# Patient Record
Sex: Female | Born: 1980 | Race: White | Hispanic: No | Marital: Married | Smoking: Never smoker
Health system: Southern US, Community
[De-identification: ages and names within clinical notes are randomized; demographics above are authoritative.]

## PROBLEM LIST (undated history)

## (undated) DIAGNOSIS — K219 Gastro-esophageal reflux disease without esophagitis: Secondary | ICD-10-CM

## (undated) DIAGNOSIS — K8020 Calculus of gallbladder without cholecystitis without obstruction: Secondary | ICD-10-CM

## (undated) DIAGNOSIS — R112 Nausea with vomiting, unspecified: Secondary | ICD-10-CM

## (undated) DIAGNOSIS — R079 Chest pain, unspecified: Principal | ICD-10-CM

## (undated) DIAGNOSIS — Z9889 Other specified postprocedural states: Secondary | ICD-10-CM

## (undated) DIAGNOSIS — Z8742 Personal history of other diseases of the female genital tract: Secondary | ICD-10-CM

## (undated) DIAGNOSIS — R51 Headache: Secondary | ICD-10-CM

## (undated) HISTORY — DX: Headache: R51

## (undated) HISTORY — DX: Chest pain, unspecified: R07.9

## (undated) HISTORY — PX: BREAST ENHANCEMENT SURGERY: SHX7

## (undated) HISTORY — PX: AUGMENTATION MAMMAPLASTY: SUR837

---

## 2001-03-16 HISTORY — PX: BREAST ENHANCEMENT SURGERY: SHX7

## 2004-05-14 HISTORY — PX: CERVICAL BIOPSY  W/ LOOP ELECTRODE EXCISION: SUR135

## 2004-12-24 ENCOUNTER — Other Ambulatory Visit: Admission: RE | Admit: 2004-12-24 | Discharge: 2004-12-24 | Payer: Self-pay | Admitting: Obstetrics and Gynecology

## 2005-03-24 ENCOUNTER — Inpatient Hospital Stay (HOSPITAL_COMMUNITY): Admission: AD | Admit: 2005-03-24 | Discharge: 2005-03-24 | Payer: Self-pay | Admitting: Obstetrics and Gynecology

## 2005-03-31 ENCOUNTER — Inpatient Hospital Stay (HOSPITAL_COMMUNITY): Admission: RE | Admit: 2005-03-31 | Discharge: 2005-03-31 | Payer: Self-pay | Admitting: Obstetrics and Gynecology

## 2005-04-07 ENCOUNTER — Inpatient Hospital Stay (HOSPITAL_COMMUNITY): Admission: AD | Admit: 2005-04-07 | Discharge: 2005-04-07 | Payer: Self-pay | Admitting: Obstetrics and Gynecology

## 2005-05-19 ENCOUNTER — Inpatient Hospital Stay (HOSPITAL_COMMUNITY): Admission: AD | Admit: 2005-05-19 | Discharge: 2005-05-19 | Payer: Self-pay | Admitting: Obstetrics and Gynecology

## 2005-05-20 ENCOUNTER — Inpatient Hospital Stay (HOSPITAL_COMMUNITY): Admission: AD | Admit: 2005-05-20 | Discharge: 2005-05-20 | Payer: Self-pay | Admitting: Obstetrics and Gynecology

## 2005-07-26 ENCOUNTER — Inpatient Hospital Stay (HOSPITAL_COMMUNITY): Admission: AD | Admit: 2005-07-26 | Discharge: 2005-07-28 | Payer: Self-pay | Admitting: Obstetrics and Gynecology

## 2006-07-23 ENCOUNTER — Emergency Department (HOSPITAL_COMMUNITY): Admission: EM | Admit: 2006-07-23 | Discharge: 2006-07-23 | Payer: Self-pay | Admitting: Emergency Medicine

## 2006-10-14 ENCOUNTER — Encounter: Admission: RE | Admit: 2006-10-14 | Discharge: 2006-10-14 | Payer: Self-pay | Admitting: Family Medicine

## 2010-08-01 NOTE — Discharge Summary (Signed)
Wanda Stafford               ACCOUNT NO.:  [id number]   MEDICAL RECORD NO.:  [id number]          PATIENT TYPE:  INP   LOCATION:  9103                          FACILITY:  [facility]   PHYSICIAN:  Malachi Pro. Ambrose Mantle, M.D. DATE OF BIRTH:  11/12/1980   DATE OF ADMISSION:  07/26/2005  DATE OF DISCHARGE:  07/28/2005                                 DISCHARGE SUMMARY   A 30 year old white female, para 0-1-0-1, gravida 2, 36+ weeks' gestation,  [facility] August 18, 2005 - by a 9-week ultrasound, presented to labor and delivery  in labor with cervical change to his 5-cm with intact bag of waters.   PRENATAL CARE:  Complicated by preterm cervical shortening to 3-cm at 18 to  20 weeks, placed on a weekly Delalutin injections and bed rest at 27 weeks.  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  Blood group and type O+, negative antibody.  RPR nonreactive.  Rubella equivocal.  Hepatitis B surface antigen negative.  HIV negative.  GC and Chlamydia negative.  Pap smear normal.  Group B strep  negative per patient.  A one-hour Glucola 105.   PAST MEDICAL HISTORY:  1.  In 1998, she had a spontaneous vaginal delivery of a 5 pound 1 ounce      infant at 34+ weeks.  2.  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  Repeat      Pap was advised postpartum.  3.  The patient has had a history of anxiety on no medications.  4.  She did have breast augmentation in 2003.   She has no known allergies.   She is on prenatal vitamins.   Her physical exam is normal.  The cervix 5-cm.  The patient got an epidural  and artificial rupture of the membranes produced clear fluid.  She reached  complete dilatation and pushed well with a spontaneous vaginal delivery of a  vigorous female infant over an intact perineum without Apgar's 8 at one and 9  at five minutes, weight of 5 pounds 15 ounces.  Placenta delivered  spontaneously and handed off for cord  blood donation.  Cervix and rectum  were intact.  Blood loss about 350 cc.  Dr. Senaida Stafford was in attendance.  Postpartum the patient did well and was discharged on the second postpartum  day.   LABORATORY DATA:  Showed initial hemoglobin of 12.4, hematocrit 35.9, white  count 7,300, platelet count 243,000.  RPR nonreactive.  Followup hemoglobin  12.3.   FINAL DIAGNOSES:  Intrauterine pregnancy at 36+ weeks delivered vertex.   OPERATION:  Spontaneous delivery vertex.   FINAL CONDITION:  Improved.   INSTRUCTIONS:  1.  Include our regular discharge instruction booklet.  2.  The patient will be offered the rubella vaccine prior to discharge.  3.  She is advised to return in six weeks for followup examination.   Percocet 5/325, 20 tablets, one every four to six hours as needed for pain  is given at discharge.      [name]  Corwin Levins, M.D.  Electronically Signed     TFH/MEDQ  D:  07/28/2005  T:  07/28/2005  Job:  [id number]

## 2013-03-24 ENCOUNTER — Other Ambulatory Visit: Payer: Self-pay | Admitting: Family Medicine

## 2013-03-24 DIAGNOSIS — R519 Headache, unspecified: Secondary | ICD-10-CM

## 2013-03-24 DIAGNOSIS — R51 Headache: Principal | ICD-10-CM

## 2013-03-27 ENCOUNTER — Encounter: Payer: Self-pay | Admitting: General Surgery

## 2013-03-27 ENCOUNTER — Ambulatory Visit (INDEPENDENT_AMBULATORY_CARE_PROVIDER_SITE_OTHER): Payer: 59 | Admitting: Interventional Cardiology

## 2013-03-27 ENCOUNTER — Ambulatory Visit
Admission: RE | Admit: 2013-03-27 | Discharge: 2013-03-27 | Disposition: A | Discharge status: Home / Self Care | Payer: 59 | Source: Ambulatory Visit | Attending: Family Medicine | Admitting: Family Medicine

## 2013-03-27 ENCOUNTER — Encounter: Payer: Self-pay | Admitting: Interventional Cardiology

## 2013-03-27 VITALS — BP 118/55 | HR 73 | Ht 66.0 in | Wt 140.2 lb

## 2013-03-27 DIAGNOSIS — R51 Headache: Principal | ICD-10-CM

## 2013-03-27 DIAGNOSIS — R079 Chest pain, unspecified: Secondary | ICD-10-CM | POA: Insufficient documentation

## 2013-03-27 DIAGNOSIS — R519 Headache, unspecified: Secondary | ICD-10-CM

## 2013-03-27 NOTE — Progress Notes (Signed)
Patient ID: Wanda Stafford, female   DOB: 07-14-1980, 33 y.o.   MRN: [id number]     Patient ID: Wanda ConnersCarol A Stafford MRN: [id number] DOB/AGE: 07-14-1980 32 y.o.   Referring Physician Dr. Zachery DauerBarnes   Reason for Consultation  Chest tightness  HPI: 33 y/o who has had no significant medical issues.  She had been exercising, but then stopped for a while.  She had been trying to exercise more of late.  SHe tried a boot camp.  It cause some DOE.  Some dizziness with migraines.  Some activities were causing SHOB that don't normal cause issues such as walking or getting out of the bathtub.    Mother had some type of arrhythmia.  It has not been a longterm problem.  Brother was born with a heart murmur.  No early CAD that she knows of.    She also has some chest discomfort. It is not related to exertion. It occurs in the upper left chest and lateral to her left breast. It is worse with deep breathing. She can push on these areas as she did here in the office today and reproduce the pain. She does not recall any trauma to the area.   Current Outpatient Prescriptions  Medication Sig Dispense Refill  . ALPRAZolam (XANAX) 0.25 MG tablet Take 0.25 mg by mouth as needed for anxiety (as needed for anxiety).      . Norgestimate-Ethinyl Estradiol Triphasic (TRI-SPRINTEC) 0.18/0.215/0.25 MG-35 MCG tablet Take 1 tablet by mouth daily.       No current facility-administered medications for this visit.   Past Medical History  Diagnosis Date  . Chest pain   . Headache     Family History  Problem Relation Age of Onset  . Arrhythmia Mother   . Heart murmur Brother     History   Social History  . Marital Status: Married    Spouse Name: N/A    Number of Children: N/A  . Years of Education: N/A   Occupational History  . Not on file.   Social History Main Topics  . Smoking status: Never Smoker   . Smokeless tobacco: Not on file  . Alcohol Use: Yes     Comment: 2 glasses daily  . Drug Use: No  .  Sexual Activity: Not on file   Other Topics Concern  . Not on file   Social History Narrative  . No narrative on file    Past Surgical History  Procedure Laterality Date  . Breast enhancement surgery        (Not in a hospital admission)  Review of systems complete and found to be negative unless listed above .  No nausea, vomiting.  No fever chills, No focal weakness,  No palpitations.  Physical Exam: Filed Vitals:   03/27/13 1445  BP: 118/55  Pulse: 73    Weight: 140 lb 3.2 oz (63.594 kg)  Physical exam:  [place]/AT EOMI No JVD, No carotid bruit RRR S1S2 ; reproducible chest wall pain/soreness in the upper left chest with palpation. No wheezing Soft. NT, nondistended No edema. No focal motor or sensory deficits Normal affect  Labs:   No results found for this basename: WBC, HGB, HCT, MCV, PLT   No results found for this basename: NA, K, CL, CO2, BUN, CREATININE, CALCIUM, LABALBU, PROT, BILITOT, ALKPHOS, ALT, AST, GLUCOSE,  in the last 168 hours No results found for this basename: CKTOTAL, CKMB, CKMBINDEX, TROPONINI    No results found for this basename: CHOL  No results found for this basename: HDL   No results found for this basename: LDLCALC   No results found for this basename: TRIG   No results found for this basename: CHOLHDL   No results found for this basename: LDLDIRECT       EKG: Normal sinus rhythm, RSR prime in V1 and V2. No significant ST segment changes.  ASSESSMENT AND PLAN:   1) abnormal ECG. Minimal abnormality in the precordial leads with the rSR'.  This may be normal for her. Would not plan for any further cardiac testing.  2)  chest pain: Several atypical features. She does a very rigorous exercise regimen called to camp and does not have chest pain with this. I think it is very unlikely that she would have ischemic coronary disease. She describes the pain more as a soreness in her chest wall. She has some today with deep breathing because  she just worked out. She is able to reproduce this with palpation.  Mild shortness of breath as well.  No risk factors for coronary artery disease at this point. We'll not pursue any stress testing. If symptoms get worse, she will let us know. She will followup on an as needed basis. We talked about the importance of regular exercise and primary prevention to avoid coronary artery disease.  Signed:   Fredric Mare, MD, [facility] 03/27/2013, 3:47 PM

## 2013-03-27 NOTE — Patient Instructions (Signed)
Your physician recommends that you continue on your current medications as directed. Please refer to the Current Medication list given to you today.   Your physician recommends that you schedule a follow-up appointment as needed  

## 2013-03-28 ENCOUNTER — Encounter: Payer: Self-pay | Admitting: Interventional Cardiology

## 2013-03-29 ENCOUNTER — Other Ambulatory Visit: Payer: Self-pay

## 2013-04-13 ENCOUNTER — Ambulatory Visit: Payer: Self-pay | Admitting: Cardiology

## 2013-07-12 ENCOUNTER — Other Ambulatory Visit: Payer: Self-pay | Admitting: Family Medicine

## 2013-07-12 DIAGNOSIS — R109 Unspecified abdominal pain: Secondary | ICD-10-CM

## 2013-07-13 ENCOUNTER — Ambulatory Visit
Admission: RE | Admit: 2013-07-13 | Discharge: 2013-07-13 | Disposition: A | Discharge status: Home / Self Care | Payer: 59 | Source: Ambulatory Visit | Attending: Family Medicine | Admitting: Family Medicine

## 2013-07-13 DIAGNOSIS — R109 Unspecified abdominal pain: Secondary | ICD-10-CM

## 2013-07-13 MED ORDER — IOHEXOL 300 MG/ML  SOLN
100.0000 mL | Freq: Once | INTRAMUSCULAR | Status: AC | PRN
  Administered 2013-07-13: 100 mL via INTRAVENOUS

## 2014-03-16 NOTE — L&D Delivery Note (Signed)
Delivery Note Pt reached complete dilation and pushed well.   At 2:48 AM a healthy female was delivered via  (Presentation: OA).  APGAR:8 ,9 ; weight pending .   Placenta status:  Delivered apontaneously .  Cord:  with the following complications: none .    Anesthesia: Epidural  Episiotomy:  none Lacerations: none  Suture Repair: n/a Est. Blood Loss (mL):   Mom to postpartum.  Baby to Couplet care / Skin to Skin.  Oliver Pila 11/09/2014, 3:09 AM

## 2014-05-08 LAB — OB RESULTS CONSOLE RUBELLA ANTIBODY, IGM: Rubella: IMMUNE

## 2014-05-08 LAB — OB RESULTS CONSOLE GC/CHLAMYDIA
Chlamydia: NEGATIVE
Gonorrhea: NEGATIVE

## 2014-05-08 LAB — OB RESULTS CONSOLE ABO/RH: RH Type: POSITIVE

## 2014-05-08 LAB — OB RESULTS CONSOLE HEPATITIS B SURFACE ANTIGEN: Hepatitis B Surface Ag: NEGATIVE

## 2014-05-08 LAB — OB RESULTS CONSOLE HIV ANTIBODY (ROUTINE TESTING): HIV: NONREACTIVE

## 2014-05-08 LAB — OB RESULTS CONSOLE ANTIBODY SCREEN: Antibody Screen: NEGATIVE

## 2014-05-08 LAB — OB RESULTS CONSOLE RPR: RPR: NONREACTIVE

## 2014-10-26 LAB — OB RESULTS CONSOLE GBS: GBS: NEGATIVE

## 2014-11-08 ENCOUNTER — Inpatient Hospital Stay (HOSPITAL_COMMUNITY)
Admission: AD | Admit: 2014-11-08 | Discharge: 2014-11-10 | DRG: 775 | Disposition: A | Discharge status: Home / Self Care | Payer: 59 | Source: Ambulatory Visit | Attending: Obstetrics and Gynecology | Admitting: Obstetrics and Gynecology

## 2014-11-08 ENCOUNTER — Encounter (HOSPITAL_COMMUNITY): Payer: Self-pay | Admitting: *Deleted

## 2014-11-08 DIAGNOSIS — Z3A36 36 weeks gestation of pregnancy: Secondary | ICD-10-CM | POA: Diagnosis present

## 2014-11-08 DIAGNOSIS — O4290 Premature rupture of membranes, unspecified as to length of time between rupture and onset of labor, unspecified weeks of gestation: Secondary | ICD-10-CM | POA: Diagnosis present

## 2014-11-08 LAB — CBC
HCT: 33.5 % — ABNORMAL LOW (ref 36.0–46.0)
Hemoglobin: 11.4 g/dL — ABNORMAL LOW (ref 12.0–15.0)
MCH: 30.3 pg (ref 26.0–34.0)
MCHC: 34 g/dL (ref 30.0–36.0)
MCV: 89.1 fL (ref 78.0–100.0)
Platelets: 199 10*3/uL (ref 150–400)
RBC: 3.76 MIL/uL — ABNORMAL LOW (ref 3.87–5.11)
RDW: 12.4 % (ref 11.5–15.5)
WBC: 7.3 10*3/uL (ref 4.0–10.5)

## 2014-11-08 LAB — POCT FERN TEST: POCT Fern Test: POSITIVE

## 2014-11-08 MED ORDER — FENTANYL 2.5 MCG/ML BUPIVACAINE 1/10 % EPIDURAL INFUSION (WH - ANES)
14.0000 mL/h | INTRAMUSCULAR | Status: DC | PRN
  Administered 2014-11-09: 14 mL/h via EPIDURAL
  Filled 2014-11-08: qty 125

## 2014-11-08 MED ORDER — DIPHENHYDRAMINE HCL 50 MG/ML IJ SOLN
12.5000 mg | INTRAMUSCULAR | Status: DC | PRN
  Administered 2014-11-09: 12.5 mg via INTRAVENOUS
  Filled 2014-11-08: qty 1

## 2014-11-08 MED ORDER — CITRIC ACID-SODIUM CITRATE 334-500 MG/5ML PO SOLN: 30.0000 mL | ORAL | Status: DC | PRN

## 2014-11-08 MED ORDER — OXYCODONE-ACETAMINOPHEN 5-325 MG PO TABS: 1.0000 | ORAL_TABLET | ORAL | Status: DC | PRN

## 2014-11-08 MED ORDER — PHENYLEPHRINE 40 MCG/ML (10ML) SYRINGE FOR IV PUSH (FOR BLOOD PRESSURE SUPPORT)
80.0000 ug | PREFILLED_SYRINGE | INTRAVENOUS | Status: DC | PRN
  Filled 2014-11-08: qty 2
  Filled 2014-11-08: qty 20

## 2014-11-08 MED ORDER — OXYCODONE-ACETAMINOPHEN 5-325 MG PO TABS: 2.0000 | ORAL_TABLET | ORAL | Status: DC | PRN

## 2014-11-08 MED ORDER — OXYTOCIN BOLUS FROM INFUSION
500.0000 mL | INTRAVENOUS | Status: DC
  Administered 2014-11-09: 500 mL via INTRAVENOUS

## 2014-11-08 MED ORDER — ACETAMINOPHEN 325 MG PO TABS: 650.0000 mg | ORAL_TABLET | ORAL | Status: DC | PRN

## 2014-11-08 MED ORDER — LACTATED RINGERS IV SOLN
500.0000 mL | INTRAVENOUS | Status: DC | PRN
  Administered 2014-11-08: 1000 mL via INTRAVENOUS

## 2014-11-08 MED ORDER — LACTATED RINGERS IV SOLN
INTRAVENOUS | Status: DC
  Administered 2014-11-08 – 2014-11-09 (×2): via INTRAVENOUS

## 2014-11-08 MED ORDER — OXYTOCIN 40 UNITS IN LACTATED RINGERS INFUSION - SIMPLE MED: 62.5000 mL/h | INTRAVENOUS | Status: DC

## 2014-11-08 MED ORDER — TERBUTALINE SULFATE 1 MG/ML IJ SOLN: 0.2500 mg | Freq: Once | INTRAMUSCULAR | Status: DC | PRN

## 2014-11-08 MED ORDER — ONDANSETRON HCL 4 MG/2ML IJ SOLN: 4.0000 mg | Freq: Four times a day (QID) | INTRAMUSCULAR | Status: DC | PRN

## 2014-11-08 MED ORDER — LIDOCAINE HCL (PF) 1 % IJ SOLN
30.0000 mL | INTRAMUSCULAR | Status: DC | PRN
  Filled 2014-11-08: qty 30

## 2014-11-08 MED ORDER — OXYTOCIN 40 UNITS IN LACTATED RINGERS INFUSION - SIMPLE MED
1.0000 m[IU]/min | INTRAVENOUS | Status: DC
  Administered 2014-11-08: 2 m[IU]/min via INTRAVENOUS
  Filled 2014-11-08: qty 1000

## 2014-11-08 MED ORDER — EPHEDRINE 5 MG/ML INJ
10.0000 mg | INTRAVENOUS | Status: DC | PRN
  Filled 2014-11-08: qty 2

## 2014-11-08 MED ORDER — BUTORPHANOL TARTRATE 1 MG/ML IJ SOLN: 1.0000 mg | INTRAMUSCULAR | Status: DC | PRN

## 2014-11-08 NOTE — MAU Note (Signed)
Pt presents to MAU with complaints of leakage of fluid that started around 9 tonight. Denies any vaginal bleeding

## 2014-11-08 NOTE — H&P (Signed)
Wanda Stafford is a 34 y.o. female [phone] at 36+ weeks (EDD 12/02/14 by LMP c/w 10 week [place])  presenting for SROM at home about 9pm.  She is having mild contractions.  Prenatal care is complicated by  h/o preterm delivery x 2 at 34 and 36 weeks.  She has received 17-P weekly since [redacted] weeks gestation.  Her husband also tested + for HSV-2 antibodies and she was negative.  Neither have ever noted an outbreak, so I counseled her to use condoms  with intercourse while pregnant.  She measures slightly size>dates.  An [place] 10/25/14 showed baby at 46%ile and AFI 18.  Maternal Medical History:  Reason for admission: Rupture of membranes.   Contractions: Onset was 1-2 hours ago.   Frequency: irregular.   Perceived severity is mild.    Fetal activity: Perceived fetal activity is normal.    Prenatal Complications - Diabetes: none.    Past OB Hx 1998 NSVD 34 weeks 5#1oz 2007  NSVD 36 weeks 5#15oz 2015  SAB  Past Medical History  Diagnosis Date  . Chest pain   . Headache    Past Surgical History  Procedure Laterality Date  . Breast enhancement surgery     Family History: family history includes Arrhythmia in her mother; Heart murmur in her brother. Social History:  reports that she has never smoked. She does not have any smokeless tobacco history on file. She reports that she drinks alcohol. She reports that she does not use illicit drugs.   Prenatal Transfer Tool  Maternal Diabetes: No Genetic Screening: Normal Maternal Ultrasounds/Referrals: Normal Fetal Ultrasounds or other Referrals:  None Maternal Substance Abuse:  No Significant Maternal Medications:  None Significant Maternal Lab Results:  None Other Comments:  None  ROS  Dilation: 3 Effacement (%): 80 Blood pressure 134/81, pulse 83, temperature 98.3 F (36.8 C), resp. rate 18, last menstrual period 02/25/2014. Maternal Exam:  Uterine Assessment: Contraction strength is mild.  Contraction frequency is irregular.    Abdomen: Patient reports no abdominal tenderness. Fetal presentation: vertex  Introitus: Normal vulva. Normal vagina.  Ferning test: positive.  Amniotic fluid character: clear.     Physical Exam  Constitutional: She appears well-developed and well-nourished.  Cardiovascular: Normal rate and regular rhythm.   Respiratory: Effort normal.  GI: Soft.  Genitourinary: Vagina normal.  Neurological: She is alert.  Psychiatric: She has a normal mood and affect.    Prenatal labs: ABO, Rh:  O posiitve Antibody:  negative Rubella:  immune RPR:   NR HBsAg:   Neg HIV:   NR GBS:   Neg One hour GTT 113 First trimester screen and AFP negative CF negative in prior pregnancy  Assessment/Plan: Pt admitted for SROM.  She will be admitted and augmented with pitocin   Syanna Remmert W 11/08/2014, 9:58 PM

## 2014-11-08 NOTE — Progress Notes (Signed)
Dr Senaida Ores notified of pt's arrival ROM,  + FERN orders received to admit pt

## 2014-11-09 ENCOUNTER — Encounter (HOSPITAL_COMMUNITY): Payer: Self-pay

## 2014-11-09 ENCOUNTER — Inpatient Hospital Stay (HOSPITAL_COMMUNITY): Payer: 59 | Admitting: Anesthesiology

## 2014-11-09 LAB — ABO/RH: ABO/RH(D): O POS

## 2014-11-09 LAB — CBC
HCT: 35.1 % — ABNORMAL LOW (ref 36.0–46.0)
Hemoglobin: 11.8 g/dL — ABNORMAL LOW (ref 12.0–15.0)
MCH: 30 pg (ref 26.0–34.0)
MCHC: 33.6 g/dL (ref 30.0–36.0)
MCV: 89.3 fL (ref 78.0–100.0)
Platelets: 184 10*3/uL (ref 150–400)
RBC: 3.93 MIL/uL (ref 3.87–5.11)
RDW: 12.5 % (ref 11.5–15.5)
WBC: 10.9 10*3/uL — ABNORMAL HIGH (ref 4.0–10.5)

## 2014-11-09 LAB — TYPE AND SCREEN
ABO/RH(D): O POS
Antibody Screen: NEGATIVE

## 2014-11-09 LAB — RPR: RPR Ser Ql: NONREACTIVE

## 2014-11-09 MED ORDER — ACETAMINOPHEN 325 MG PO TABS: 650.0000 mg | ORAL_TABLET | ORAL | Status: DC | PRN

## 2014-11-09 MED ORDER — DIBUCAINE 1 % RE OINT: 1.0000 "application " | TOPICAL_OINTMENT | RECTAL | Status: DC | PRN

## 2014-11-09 MED ORDER — LANOLIN HYDROUS EX OINT: TOPICAL_OINTMENT | CUTANEOUS | Status: DC | PRN

## 2014-11-09 MED ORDER — FENTANYL 2.5 MCG/ML BUPIVACAINE 1/10 % EPIDURAL INFUSION (WH - ANES): 14.0000 mL/h | INTRAMUSCULAR | Status: DC | PRN

## 2014-11-09 MED ORDER — ONDANSETRON HCL 4 MG PO TABS: 4.0000 mg | ORAL_TABLET | ORAL | Status: DC | PRN

## 2014-11-09 MED ORDER — LIDOCAINE HCL (PF) 1 % IJ SOLN
INTRAMUSCULAR | Status: DC | PRN
  Administered 2014-11-09: 4 mL
  Administered 2014-11-09: 6 mL via EPIDURAL

## 2014-11-09 MED ORDER — IBUPROFEN 600 MG PO TABS
600.0000 mg | ORAL_TABLET | Freq: Four times a day (QID) | ORAL | Status: DC
  Administered 2014-11-09 – 2014-11-10 (×6): 600 mg via ORAL
  Filled 2014-11-09 (×6): qty 1

## 2014-11-09 MED ORDER — LORATADINE 10 MG PO TABS
10.0000 mg | ORAL_TABLET | Freq: Every day | ORAL | Status: DC | PRN
  Filled 2014-11-09: qty 1

## 2014-11-09 MED ORDER — OXYCODONE-ACETAMINOPHEN 5-325 MG PO TABS: 1.0000 | ORAL_TABLET | ORAL | Status: DC | PRN

## 2014-11-09 MED ORDER — PRENATAL MULTIVITAMIN CH
1.0000 | ORAL_TABLET | Freq: Every day | ORAL | Status: DC
  Administered 2014-11-09 – 2014-11-10 (×2): 1 via ORAL
  Filled 2014-11-09 (×2): qty 1

## 2014-11-09 MED ORDER — BENZOCAINE-MENTHOL 20-0.5 % EX AERO: 1.0000 "application " | INHALATION_SPRAY | CUTANEOUS | Status: DC | PRN

## 2014-11-09 MED ORDER — NALBUPHINE HCL 10 MG/ML IJ SOLN
2.5000 mg | INTRAMUSCULAR | Status: DC | PRN
  Administered 2014-11-09: 2.5 mg via INTRAVENOUS
  Filled 2014-11-09: qty 1

## 2014-11-09 MED ORDER — ZOLPIDEM TARTRATE 5 MG PO TABS: 5.0000 mg | ORAL_TABLET | Freq: Every evening | ORAL | Status: DC | PRN

## 2014-11-09 MED ORDER — SIMETHICONE 80 MG PO CHEW: 80.0000 mg | CHEWABLE_TABLET | ORAL | Status: DC | PRN

## 2014-11-09 MED ORDER — ONDANSETRON HCL 4 MG/2ML IJ SOLN: 4.0000 mg | INTRAMUSCULAR | Status: DC | PRN

## 2014-11-09 MED ORDER — WITCH HAZEL-GLYCERIN EX PADS: 1.0000 "application " | MEDICATED_PAD | CUTANEOUS | Status: DC | PRN

## 2014-11-09 MED ORDER — INFLUENZA VAC SPLIT QUAD 0.5 ML IM SUSY
0.5000 mL | PREFILLED_SYRINGE | INTRAMUSCULAR | Status: AC
  Administered 2014-11-09: 0.5 mL via INTRAMUSCULAR
  Filled 2014-11-09: qty 0.5

## 2014-11-09 MED ORDER — DIPHENHYDRAMINE HCL 25 MG PO CAPS: 25.0000 mg | ORAL_CAPSULE | Freq: Four times a day (QID) | ORAL | Status: DC | PRN

## 2014-11-09 MED ORDER — OXYCODONE-ACETAMINOPHEN 5-325 MG PO TABS: 2.0000 | ORAL_TABLET | ORAL | Status: DC | PRN

## 2014-11-09 MED ORDER — NALBUPHINE HCL 10 MG/ML IJ SOLN: 2.5000 mg | INTRAMUSCULAR | Status: DC

## 2014-11-09 MED ORDER — TETANUS-DIPHTH-ACELL PERTUSSIS 5-2.5-18.5 LF-MCG/0.5 IM SUSP: 0.5000 mL | Freq: Once | INTRAMUSCULAR | Status: DC

## 2014-11-09 MED ORDER — SENNOSIDES-DOCUSATE SODIUM 8.6-50 MG PO TABS
2.0000 | ORAL_TABLET | ORAL | Status: DC
  Administered 2014-11-09: 2 via ORAL
  Filled 2014-11-09: qty 2

## 2014-11-09 NOTE — Anesthesia Preprocedure Evaluation (Signed)

## 2014-11-09 NOTE — Anesthesia Postprocedure Evaluation (Signed)
  Anesthesia Post-op Note  Patient: Wanda Stafford  Procedure(s) Performed: * No procedures listed *  Patient Location: [facility] Unit  Anesthesia Type:Epidural  Level of Consciousness: awake  Airway and Oxygen Therapy: Patient Spontanous Breathing  Post-op Pain: mild  Post-op Assessment: Patient's Cardiovascular Status Stable and Respiratory Function Stable              Post-op Vital Signs: stable  Last Vitals:  Filed Vitals:   11/09/14 0706  BP: 111/63  Pulse: 72  Temp: 36.4 C  Resp: 16    Complications: No apparent anesthesia complications

## 2014-11-09 NOTE — Anesthesia Procedure Notes (Signed)

## 2014-11-09 NOTE — Progress Notes (Signed)
Patient ID: Wanda Stafford, female   DOB: Mar 07, 1981, 34 y.o.   MRN: [id number] DOD  Doing fine no c/o

## 2014-11-10 MED ORDER — OXYCODONE-ACETAMINOPHEN 5-325 MG PO TABS: 1.0000 | ORAL_TABLET | Freq: Four times a day (QID) | ORAL | Status: DC | PRN

## 2014-11-10 MED ORDER — IBUPROFEN 800 MG PO TABS: 800.0000 mg | ORAL_TABLET | Freq: Three times a day (TID) | ORAL | Status: DC | PRN

## 2014-11-10 MED ORDER — PRENATAL MULTIVITAMIN CH: 1.0000 | ORAL_TABLET | Freq: Every day | ORAL | Status: DC

## 2014-11-10 NOTE — Discharge Summary (Signed)
Obstetric Discharge Summary Reason for Admission: rupture of membranes Prenatal Procedures: none Intrapartum Procedures: spontaneous vaginal delivery Postpartum Procedures: none Complications-Operative and Postpartum: none HEMOGLOBIN  Date Value Ref Range Status  11/09/2014 11.8* 12.0 - 15.0 g/dL Final   HCT  Date Value Ref Range Status  11/09/2014 35.1* 36.0 - 46.0 % Final    Physical Exam:  General: alert and no distress Lochia: appropriate Uterine Fundus: firm   Discharge Diagnoses: Term Pregnancy-delivered  Discharge Information: Date: 11/10/2014 Activity: pelvic rest Diet: routine Medications: PNV, Ibuprofen and Percocet Condition: stable Instructions: refer to practice specific booklet Discharge to: home Follow-up Information    Follow up with Oliver Pila, MD. Schedule an appointment as soon as possible for a visit in 6 weeks.   Specialty:  Obstetrics and Gynecology   Why:  for post-partum check   Contact information:   [address] [place] [address] [phone]       Newborn Data: Live born female  Birth Weight: 6 lb 9.8 oz (2999 g) APGAR: 9, 9  Home with mother.  Wanda Stafford, Wanda Stafford 11/10/2014, 8:31 AM

## 2014-11-10 NOTE — Progress Notes (Signed)
Post Partum Day 1 Subjective: no complaints, up ad lib, voiding, tolerating PO and nl lochia, pain controlled  Objective: Blood pressure 107/61, pulse 63, temperature 97.8 F (36.6 C), temperature source Oral, resp. rate 18, height  (1.676 m), weight 85.276 kg (188 lb), last menstrual period 02/25/2014, SpO2 100 %, unknown if currently breastfeeding.  Physical Exam:  General: alert and no distress Lochia: appropriate Uterine Fundus: firm   Recent Labs  11/08/14 2220 11/09/14 0848  HGB 11.4* 11.8*  HCT 33.5* 35.1*    Assessment/Plan: Discharge home per pt request.  Routine PP care, d/c with Motrin, percocet and PNV.  F/u 6wks   LOS: 2 days   Bovard-Stuckert, Iniko Robles 11/10/2014, 7:58 AM

## 2014-11-10 NOTE — Progress Notes (Signed)
Discharge teaching complete. Pt understood all information and did not have nay questions.

## 2015-06-25 DIAGNOSIS — Z113 Encounter for screening for infections with a predominantly sexual mode of transmission: Secondary | ICD-10-CM | POA: Diagnosis not present

## 2015-06-25 DIAGNOSIS — R102 Pelvic and perineal pain: Secondary | ICD-10-CM | POA: Diagnosis not present

## 2015-06-25 DIAGNOSIS — N926 Irregular menstruation, unspecified: Secondary | ICD-10-CM | POA: Diagnosis not present

## 2015-06-27 DIAGNOSIS — N939 Abnormal uterine and vaginal bleeding, unspecified: Secondary | ICD-10-CM | POA: Diagnosis not present

## 2015-07-19 DIAGNOSIS — Z1211 Encounter for screening for malignant neoplasm of colon: Secondary | ICD-10-CM | POA: Diagnosis not present

## 2015-07-19 DIAGNOSIS — Z01818 Encounter for other preprocedural examination: Secondary | ICD-10-CM | POA: Diagnosis not present

## 2015-09-18 DIAGNOSIS — Z1283 Encounter for screening for malignant neoplasm of skin: Secondary | ICD-10-CM | POA: Diagnosis not present

## 2017-04-07 DIAGNOSIS — H9201 Otalgia, right ear: Secondary | ICD-10-CM | POA: Diagnosis not present

## 2017-04-07 DIAGNOSIS — Z130 Encounter for screening for diseases of the blood and blood-forming organs and certain disorders involving the immune mechanism: Secondary | ICD-10-CM | POA: Diagnosis not present

## 2017-04-07 DIAGNOSIS — Z01419 Encounter for gynecological examination (general) (routine) without abnormal findings: Secondary | ICD-10-CM | POA: Diagnosis not present

## 2017-04-07 DIAGNOSIS — Z6824 Body mass index (BMI) 24.0-24.9, adult: Secondary | ICD-10-CM | POA: Diagnosis not present

## 2017-04-07 DIAGNOSIS — F419 Anxiety disorder, unspecified: Secondary | ICD-10-CM | POA: Diagnosis not present

## 2017-04-07 DIAGNOSIS — Z1151 Encounter for screening for human papillomavirus (HPV): Secondary | ICD-10-CM | POA: Diagnosis not present

## 2017-04-07 DIAGNOSIS — Z1389 Encounter for screening for other disorder: Secondary | ICD-10-CM | POA: Diagnosis not present

## 2017-04-07 DIAGNOSIS — Z124 Encounter for screening for malignant neoplasm of cervix: Secondary | ICD-10-CM | POA: Diagnosis not present

## 2017-04-07 DIAGNOSIS — Z3041 Encounter for surveillance of contraceptive pills: Secondary | ICD-10-CM | POA: Diagnosis not present

## 2017-04-08 DIAGNOSIS — Z124 Encounter for screening for malignant neoplasm of cervix: Secondary | ICD-10-CM | POA: Diagnosis not present

## 2017-04-26 DIAGNOSIS — L700 Acne vulgaris: Secondary | ICD-10-CM | POA: Diagnosis not present

## 2017-06-25 DIAGNOSIS — R002 Palpitations: Secondary | ICD-10-CM | POA: Diagnosis not present

## 2017-06-25 DIAGNOSIS — R42 Dizziness and giddiness: Secondary | ICD-10-CM | POA: Diagnosis not present

## 2017-08-05 DIAGNOSIS — E559 Vitamin D deficiency, unspecified: Secondary | ICD-10-CM | POA: Diagnosis not present

## 2017-08-05 DIAGNOSIS — Z0000 Encounter for general adult medical examination without abnormal findings: Secondary | ICD-10-CM | POA: Diagnosis not present

## 2018-01-12 DIAGNOSIS — R350 Frequency of micturition: Secondary | ICD-10-CM | POA: Diagnosis not present

## 2018-01-12 DIAGNOSIS — N898 Other specified noninflammatory disorders of vagina: Secondary | ICD-10-CM | POA: Diagnosis not present

## 2018-01-12 DIAGNOSIS — N760 Acute vaginitis: Secondary | ICD-10-CM | POA: Diagnosis not present

## 2018-01-12 DIAGNOSIS — B373 Candidiasis of vulva and vagina: Secondary | ICD-10-CM | POA: Diagnosis not present

## 2018-02-15 DIAGNOSIS — B373 Candidiasis of vulva and vagina: Secondary | ICD-10-CM | POA: Diagnosis not present

## 2018-02-15 DIAGNOSIS — Z113 Encounter for screening for infections with a predominantly sexual mode of transmission: Secondary | ICD-10-CM | POA: Diagnosis not present

## 2018-02-15 DIAGNOSIS — L292 Pruritus vulvae: Secondary | ICD-10-CM | POA: Diagnosis not present

## 2018-02-15 DIAGNOSIS — N898 Other specified noninflammatory disorders of vagina: Secondary | ICD-10-CM | POA: Diagnosis not present

## 2018-03-07 DIAGNOSIS — K2970 Gastritis, unspecified, without bleeding: Secondary | ICD-10-CM | POA: Diagnosis not present

## 2018-05-05 DIAGNOSIS — N898 Other specified noninflammatory disorders of vagina: Secondary | ICD-10-CM | POA: Diagnosis not present

## 2018-05-05 DIAGNOSIS — L292 Pruritus vulvae: Secondary | ICD-10-CM | POA: Diagnosis not present

## 2018-08-23 DIAGNOSIS — E559 Vitamin D deficiency, unspecified: Secondary | ICD-10-CM | POA: Diagnosis not present

## 2018-08-23 DIAGNOSIS — Z0000 Encounter for general adult medical examination without abnormal findings: Secondary | ICD-10-CM | POA: Diagnosis not present

## 2018-08-23 DIAGNOSIS — Z8249 Family history of ischemic heart disease and other diseases of the circulatory system: Secondary | ICD-10-CM | POA: Diagnosis not present

## 2019-01-09 DIAGNOSIS — Z6826 Body mass index (BMI) 26.0-26.9, adult: Secondary | ICD-10-CM | POA: Diagnosis not present

## 2019-01-09 DIAGNOSIS — Z130 Encounter for screening for diseases of the blood and blood-forming organs and certain disorders involving the immune mechanism: Secondary | ICD-10-CM | POA: Diagnosis not present

## 2019-01-09 DIAGNOSIS — Z1389 Encounter for screening for other disorder: Secondary | ICD-10-CM | POA: Diagnosis not present

## 2019-01-09 DIAGNOSIS — Z01419 Encounter for gynecological examination (general) (routine) without abnormal findings: Secondary | ICD-10-CM | POA: Diagnosis not present

## 2019-02-14 DIAGNOSIS — R1011 Right upper quadrant pain: Secondary | ICD-10-CM | POA: Diagnosis not present

## 2019-02-14 DIAGNOSIS — R1013 Epigastric pain: Secondary | ICD-10-CM | POA: Diagnosis not present

## 2019-02-15 ENCOUNTER — Other Ambulatory Visit: Payer: Self-pay | Admitting: Family Medicine

## 2019-02-15 DIAGNOSIS — K829 Disease of gallbladder, unspecified: Secondary | ICD-10-CM

## 2019-02-15 DIAGNOSIS — R1011 Right upper quadrant pain: Secondary | ICD-10-CM

## 2019-02-16 ENCOUNTER — Other Ambulatory Visit: Payer: Self-pay

## 2019-02-16 ENCOUNTER — Ambulatory Visit (INDEPENDENT_AMBULATORY_CARE_PROVIDER_SITE_OTHER): Payer: BC Managed Care – PPO

## 2019-02-16 DIAGNOSIS — R1011 Right upper quadrant pain: Secondary | ICD-10-CM

## 2019-02-16 DIAGNOSIS — R1010 Upper abdominal pain, unspecified: Secondary | ICD-10-CM | POA: Diagnosis not present

## 2019-02-16 DIAGNOSIS — K829 Disease of gallbladder, unspecified: Secondary | ICD-10-CM

## 2019-03-06 ENCOUNTER — Other Ambulatory Visit: Payer: Self-pay | Admitting: Surgery

## 2019-03-06 DIAGNOSIS — K8020 Calculus of gallbladder without cholecystitis without obstruction: Secondary | ICD-10-CM | POA: Diagnosis not present

## 2019-03-15 ENCOUNTER — Encounter (HOSPITAL_BASED_OUTPATIENT_CLINIC_OR_DEPARTMENT_OTHER): Payer: Self-pay | Admitting: Surgery

## 2019-03-16 ENCOUNTER — Encounter (HOSPITAL_BASED_OUTPATIENT_CLINIC_OR_DEPARTMENT_OTHER): Payer: Self-pay | Admitting: Surgery

## 2019-03-16 ENCOUNTER — Other Ambulatory Visit: Payer: Self-pay

## 2019-03-16 NOTE — Progress Notes (Signed)
Spoke w/ via phone for pre-op interview--- PT Lab needs dos----   Urine preg            Lab results------ no COVID test ------ 03-18-2019 @ 1100 Arrive at ------- 1100 NPO after ------ MN w/ exception clear liquids until 0800 then nothing by mouth (no cream/ milk products) Medications to take morning of surgery ----- NONE Diabetic medication ----- n/a Patient Special Instructions ----- n/a Pre-Op special Istructions ----- n/a Patient verbalized understanding of instructions that were given at this phone interview. Patient denies shortness of breath, chest pain, fever, cough a this phone interview.

## 2019-03-18 ENCOUNTER — Other Ambulatory Visit (HOSPITAL_COMMUNITY)
Admission: RE | Admit: 2019-03-18 | Discharge: 2019-03-18 | Disposition: A | Discharge status: Home / Self Care | Payer: BC Managed Care – PPO | Source: Ambulatory Visit | Attending: Surgery | Admitting: Surgery

## 2019-03-18 DIAGNOSIS — Z01812 Encounter for preprocedural laboratory examination: Secondary | ICD-10-CM | POA: Insufficient documentation

## 2019-03-18 DIAGNOSIS — Z20822 Contact with and (suspected) exposure to covid-19: Secondary | ICD-10-CM | POA: Diagnosis not present

## 2019-03-20 LAB — NOVEL CORONAVIRUS, NAA (HOSP ORDER, SEND-OUT TO REF LAB; TAT 18-24 HRS): SARS-CoV-2, NAA: NOT DETECTED

## 2019-03-21 NOTE — Anesthesia Preprocedure Evaluation (Addendum)
Anesthesia Evaluation  Patient identified by MRN, date of birth, ID band Patient awake    Reviewed: Allergy & Precautions, NPO status , Patient's Chart, lab work & pertinent test results  History of Anesthesia Complications (+) PONV  Airway Mallampati: II  TM Distance: >3 FB Neck ROM: Full    Dental no notable dental hx. (+) Teeth Intact, Dental Advisory Given   Pulmonary neg pulmonary ROS,    Pulmonary exam normal breath sounds clear to auscultation       Cardiovascular Exercise Tolerance: Good Normal cardiovascular exam Rhythm:Regular Rate:Normal     Neuro/Psych negative neurological ROS  negative psych ROS   GI/Hepatic Neg liver ROS, GERD  ,  Endo/Other  negative endocrine ROS  Renal/GU      Musculoskeletal negative musculoskeletal ROS (+)   Abdominal   Peds  Hematology negative hematology ROS (+)   Anesthesia Other Findings   Reproductive/Obstetrics                            Anesthesia Physical Anesthesia Plan  ASA: I  Anesthesia Plan: General   Post-op Pain Management:    Induction: Intravenous  PONV Risk Score and Plan: 4 or greater and Treatment may vary due to age or medical condition, Ondansetron, Dexamethasone, Midazolam and Scopolamine patch - Pre-op  Airway Management Planned: Oral ETT  Additional Equipment: None  Intra-op Plan:   Post-operative Plan: Extubation in OR  Informed Consent: I have reviewed the patients History and Physical, chart, labs and discussed the procedure including the risks, benefits and alternatives for the proposed anesthesia with the patient or authorized representative who has indicated his/her understanding and acceptance.     Dental advisory given  Plan Discussed with: CRNA  Anesthesia Plan Comments:        Anesthesia Quick Evaluation

## 2019-03-21 NOTE — H&P (Signed)
Wanda Stafford Documented: 03/06/2019 11:22 AM Location: [facility] Patient #: [id number] DOB: 1980/08/04 Married / Language: Cleophus Molt / Race: White Female   History of Present Illness (Liany Mumpower A. Ninfa Linden MD; 03/06/2019 11:39 AM) The patient is a 39 year old female who presents for evaluation of gall stones. This is a pleasant 39 year old female referred by Dr. Leighton Ruff for evaluation of symptomatic gallstones. She has been having intermittent attacks of epigastric abdominal pain hurting him back for the last 6 months. It has now gotten much worse with nausea which is been going on for the last 48 hours. She has current pain in the back and abdomen. She denies jaundice. She underwent an ultrasound showing multiple tiny gallstones without evidence of cholecystitis. She is otherwise healthy without complaints. The pain has been moderate severe at times.   Past Surgical History Emeline Gins, [place]; 03/06/2019 11:22 AM) Breast Augmentation  Bilateral.  Diagnostic Studies History Emeline Gins, [place]; 03/06/2019 11:22 AM) Colonoscopy  never Mammogram  never Pap Smear  1-5 years ago  Allergies Emeline Gins, CMA; 03/06/2019 11:24 AM) No Known Drug Allergies [03/06/2019]: Allergies Reconciled   Medication History Emeline Gins, CMA; 03/06/2019 11:26 AM) ALPRAZolam (0.25MG  Tablet, Oral) Active. Tri-Sprintec (0.18/0.215/0.25MG -35 MCG Tablet, Oral) Active. Sertraline HCl (50MG  Tablet, Oral) Active. raNITIdine HCl (150MG  Capsule, Oral) Active. Fluconazole (150MG  Tablet, Oral) Active. Vienva (0.1-20MG -MCG Tablet, Oral) Active. Medications Reconciled  Social History Emeline Gins, [place]; 03/06/2019 11:22 AM) Alcohol use  Occasional alcohol use. Caffeine use  Coffee. No drug use  Tobacco use  Never smoker.  Family History Emeline Gins, [place]; 03/06/2019 11:22 AM) Alcohol Abuse  Mother. Anesthetic complications  Family Members In  General. Arthritis  Mother. Colon Cancer  Mother. Colon Polyps  Mother. Depression  Mother. Hypertension  Father.  Pregnancy / Birth History Emeline Gins, [place]; 03/06/2019 11:22 AM) Contraceptive History  Oral contraceptives. Gravida  4 Maternal age  31-20 Para  3  Other Problems Emeline Gins, [place]; 03/06/2019 11:22 AM) Anxiety Disorder  Back Pain  Gastroesophageal Reflux Disease     Review of Systems Emeline Gins CMA; 03/06/2019 11:22 AM) General Present- Weight Gain. Not Present- Appetite Loss, Chills, Fatigue, Fever, Night Sweats and Weight Loss. Skin Not Present- Change in Wart/Mole, Dryness, Hives, Jaundice, New Lesions, Non-Healing Wounds, Rash and Ulcer. HEENT Present- Seasonal Allergies. Not Present- Earache, Hearing Loss, Hoarseness, Nose Bleed, Oral Ulcers, Ringing in the Ears, Sinus Pain, Sore Throat, Visual Disturbances, Wears glasses/contact lenses and Yellow Eyes. Cardiovascular Not Present- Chest Pain, Difficulty Breathing Lying Down, Leg Cramps, Palpitations, Rapid Heart Rate, Shortness of Breath and Swelling of Extremities. Gastrointestinal Present- Abdominal Pain, Bloating, Excessive gas, Indigestion, Nausea and Vomiting. Not Present- Bloody Stool, Change in Bowel Habits, Chronic diarrhea, Constipation, Difficulty Swallowing, Gets full quickly at meals, Hemorrhoids and Rectal Pain. Female Genitourinary Not Present- Frequency, Nocturia, Painful Urination, Pelvic Pain and Urgency. Musculoskeletal Present- Back Pain. Not Present- Joint Pain, Joint Stiffness, Muscle Pain, Muscle Weakness and Swelling of Extremities. Neurological Not Present- Decreased Memory, Fainting, Headaches, Numbness, Seizures, Tingling, Tremor, Trouble walking and Weakness. Psychiatric Present- Anxiety. Not Present- Bipolar, Change in Sleep Pattern, Depression, Fearful and Frequent crying. Endocrine Not Present- Cold Intolerance, Excessive Hunger, Hair Changes, Heat Intolerance,  Hot flashes and New Diabetes. Hematology Not Present- Blood Thinners, Easy Bruising, Excessive bleeding, Gland problems, HIV and Persistent Infections.  Vitals Emeline Gins CMA; 03/06/2019 11:24 AM) 03/06/2019 11:24 AM Weight: 165 lb Height: 66in Body Surface Area: 1.84 m Body Mass Index: 26.63 kg/m  Temp.:  98.82F  Pulse: 74 (Regular)  BP: 110/72 (Sitting, Left Arm, Standard)       Physical Exam (Dorianna Mckiver A. Magnus Ivan MD; 03/06/2019 11:39 AM) General Mental Status-Alert. General Appearance-Consistent with stated age. Hydration-Well hydrated. Voice-Normal.  Head and Neck Head-normocephalic, atraumatic with no lesions or palpable masses.  Eye Eyeball - Bilateral-Extraocular movements intact. Sclera/Conjunctiva - Bilateral-No scleral icterus.  Chest and Lung Exam Chest and lung exam reveals -quiet, even and easy respiratory effort with no use of accessory muscles and on auscultation, normal breath sounds, no adventitious sounds and normal vocal resonance. Inspection Chest Wall - Normal. Back - normal.  Cardiovascular Cardiovascular examination reveals -on palpation PMI is normal in location and amplitude, no palpable S3 or S4. Normal cardiac borders., normal heart sounds, regular rate and rhythm with no murmurs, carotid auscultation reveals no bruits and normal pedal pulses bilaterally.  Abdomen Inspection Inspection of the abdomen reveals - No Hernias. Skin - Scar - no surgical scars. Palpation/Percussion Palpation and Percussion of the abdomen reveal - Soft, Non Tender, No Rebound tenderness, No Rigidity (guarding) and No hepatosplenomegaly. Auscultation Auscultation of the abdomen reveals - Bowel sounds normal. Note: There is minimal abdominal tenderness but no peritonitis. There is no jaundice   Neurologic - Did not examine.  Musculoskeletal - Did not examine.    Assessment & Plan (Zaydrian Batta A. Magnus Ivan MD; 03/06/2019 11:40  AM) SYMPTOMATIC CHOLELITHIASIS (K80.20) Impression: This is a patient with symptomatic gallstones. She is having fairly severe attacks much more frequently so urgent cholecystectomy is recommended. We discussed the reasons for this in detail. I discussed surgical procedure in detail. I gave her literature regarding surgery. I discussed the risks which includes but is not limited to bleeding, infection, injury to surrounding structures, bile duct injury, bile leak, cardiopulmonary issues, the need to convert to an open procedure, postoperative recovery, etc. She understands and wishes to proceed with surgery as soon as possible. I encouraged her to call and head to the emergency department if her pain acutely worsens Current Plans Started Zofran 4 MG Oral Tablet, 1 (one) Tablet every six hours, as needed, #20, 03/06/2019, Ref. x2. Started oxyCODONE HCl 5 MG Oral Tablet, 1 (one) Tablet every six hours, as needed, #25, 03/06/2019, No Refill.

## 2019-03-22 ENCOUNTER — Encounter (HOSPITAL_BASED_OUTPATIENT_CLINIC_OR_DEPARTMENT_OTHER)
Admission: RE | Disposition: A | Discharge status: Home / Self Care | Payer: Self-pay | Source: Home / Self Care | Attending: Surgery

## 2019-03-22 ENCOUNTER — Encounter (HOSPITAL_BASED_OUTPATIENT_CLINIC_OR_DEPARTMENT_OTHER): Payer: Self-pay | Admitting: Surgery

## 2019-03-22 ENCOUNTER — Ambulatory Visit (HOSPITAL_BASED_OUTPATIENT_CLINIC_OR_DEPARTMENT_OTHER)
Admission: RE | Admit: 2019-03-22 | Discharge: 2019-03-22 | Disposition: A | Discharge status: Home / Self Care | Payer: BC Managed Care – PPO | Attending: Surgery | Admitting: Surgery

## 2019-03-22 ENCOUNTER — Ambulatory Visit (HOSPITAL_BASED_OUTPATIENT_CLINIC_OR_DEPARTMENT_OTHER): Payer: BC Managed Care – PPO | Admitting: Anesthesiology

## 2019-03-22 ENCOUNTER — Other Ambulatory Visit: Payer: Self-pay

## 2019-03-22 DIAGNOSIS — Z79899 Other long term (current) drug therapy: Secondary | ICD-10-CM | POA: Insufficient documentation

## 2019-03-22 DIAGNOSIS — K811 Chronic cholecystitis: Secondary | ICD-10-CM | POA: Diagnosis not present

## 2019-03-22 DIAGNOSIS — K8020 Calculus of gallbladder without cholecystitis without obstruction: Secondary | ICD-10-CM | POA: Diagnosis not present

## 2019-03-22 DIAGNOSIS — F419 Anxiety disorder, unspecified: Secondary | ICD-10-CM | POA: Insufficient documentation

## 2019-03-22 DIAGNOSIS — K8010 Calculus of gallbladder with chronic cholecystitis without obstruction: Secondary | ICD-10-CM | POA: Insufficient documentation

## 2019-03-22 DIAGNOSIS — K219 Gastro-esophageal reflux disease without esophagitis: Secondary | ICD-10-CM | POA: Diagnosis not present

## 2019-03-22 DIAGNOSIS — Z793 Long term (current) use of hormonal contraceptives: Secondary | ICD-10-CM | POA: Diagnosis not present

## 2019-03-22 HISTORY — DX: Other specified postprocedural states: Z98.890

## 2019-03-22 HISTORY — DX: Nausea with vomiting, unspecified: R11.2

## 2019-03-22 HISTORY — DX: Calculus of gallbladder without cholecystitis without obstruction: K80.20

## 2019-03-22 HISTORY — DX: Gastro-esophageal reflux disease without esophagitis: K21.9

## 2019-03-22 HISTORY — DX: Personal history of other diseases of the female genital tract: Z87.42

## 2019-03-22 HISTORY — PX: CHOLECYSTECTOMY: SHX55

## 2019-03-22 LAB — POCT PREGNANCY, URINE: Preg Test, Ur: NEGATIVE

## 2019-03-22 SURGERY — LAPAROSCOPIC CHOLECYSTECTOMY
Anesthesia: General

## 2019-03-22 MED ORDER — CHLORHEXIDINE GLUCONATE CLOTH 2 % EX PADS
6.0000 | MEDICATED_PAD | Freq: Once | CUTANEOUS | Status: DC
  Filled 2019-03-22: qty 6

## 2019-03-22 MED ORDER — DEXAMETHASONE SODIUM PHOSPHATE 10 MG/ML IJ SOLN
INTRAMUSCULAR | Status: AC
  Filled 2019-03-22: qty 1

## 2019-03-22 MED ORDER — ONDANSETRON HCL 4 MG/2ML IJ SOLN
INTRAMUSCULAR | Status: AC
  Filled 2019-03-22: qty 2

## 2019-03-22 MED ORDER — ARTIFICIAL TEARS OPHTHALMIC OINT
TOPICAL_OINTMENT | OPHTHALMIC | Status: AC
  Filled 2019-03-22: qty 3.5

## 2019-03-22 MED ORDER — CEFAZOLIN SODIUM-DEXTROSE 2-4 GM/100ML-% IV SOLN
2.0000 g | INTRAVENOUS | Status: DC
  Filled 2019-03-22: qty 100

## 2019-03-22 MED ORDER — ONDANSETRON HCL 4 MG/2ML IJ SOLN
INTRAMUSCULAR | Status: DC | PRN
  Administered 2019-03-22: 4 mg via INTRAVENOUS

## 2019-03-22 MED ORDER — KETOROLAC TROMETHAMINE 30 MG/ML IJ SOLN
INTRAMUSCULAR | Status: AC
  Filled 2019-03-22: qty 1

## 2019-03-22 MED ORDER — OXYCODONE HCL 5 MG/5ML PO SOLN
5.0000 mg | Freq: Once | ORAL | Status: AC | PRN
  Filled 2019-03-22: qty 5

## 2019-03-22 MED ORDER — CELECOXIB 200 MG PO CAPS
200.0000 mg | ORAL_CAPSULE | ORAL | Status: AC
  Administered 2019-03-22: 200 mg via ORAL
  Filled 2019-03-22: qty 1

## 2019-03-22 MED ORDER — HYDROMORPHONE HCL 1 MG/ML IJ SOLN
0.2500 mg | INTRAMUSCULAR | Status: DC | PRN
  Administered 2019-03-22 (×2): 0.5 mg via INTRAVENOUS
  Filled 2019-03-22: qty 0.5

## 2019-03-22 MED ORDER — SUGAMMADEX SODIUM 200 MG/2ML IV SOLN
INTRAVENOUS | Status: DC | PRN
  Administered 2019-03-22: 200 mg via INTRAVENOUS

## 2019-03-22 MED ORDER — ROCURONIUM BROMIDE 10 MG/ML (PF) SYRINGE
PREFILLED_SYRINGE | INTRAVENOUS | Status: DC | PRN
  Administered 2019-03-22: 40 mg via INTRAVENOUS

## 2019-03-22 MED ORDER — ONDANSETRON HCL 4 MG/2ML IJ SOLN
4.0000 mg | Freq: Once | INTRAMUSCULAR | Status: DC | PRN
  Filled 2019-03-22: qty 2

## 2019-03-22 MED ORDER — HYDROMORPHONE HCL 1 MG/ML IJ SOLN
INTRAMUSCULAR | Status: AC
  Filled 2019-03-22: qty 1

## 2019-03-22 MED ORDER — ACETAMINOPHEN 500 MG PO TABS
1000.0000 mg | ORAL_TABLET | ORAL | Status: AC
  Administered 2019-03-22: 11:00:00 1000 mg via ORAL
  Filled 2019-03-22: qty 2

## 2019-03-22 MED ORDER — OXYCODONE-ACETAMINOPHEN 5-325 MG PO TABS: 1.0000 | ORAL_TABLET | Freq: Four times a day (QID) | ORAL | 0 refills | Status: DC | PRN

## 2019-03-22 MED ORDER — SCOPOLAMINE 1 MG/3DAYS TD PT72
MEDICATED_PATCH | TRANSDERMAL | Status: AC
  Filled 2019-03-22: qty 1

## 2019-03-22 MED ORDER — FENTANYL CITRATE (PF) 100 MCG/2ML IJ SOLN
INTRAMUSCULAR | Status: AC
  Filled 2019-03-22: qty 2

## 2019-03-22 MED ORDER — CELECOXIB 200 MG PO CAPS
ORAL_CAPSULE | ORAL | Status: AC
  Filled 2019-03-22: qty 1

## 2019-03-22 MED ORDER — SCOPOLAMINE 1 MG/3DAYS TD PT72
1.0000 | MEDICATED_PATCH | TRANSDERMAL | Status: DC
  Administered 2019-03-22: 1.5 mg via TRANSDERMAL
  Filled 2019-03-22: qty 1

## 2019-03-22 MED ORDER — LIDOCAINE 2% (20 MG/ML) 5 ML SYRINGE
INTRAMUSCULAR | Status: AC
  Filled 2019-03-22: qty 5

## 2019-03-22 MED ORDER — LACTATED RINGERS IV SOLN
INTRAVENOUS | Status: DC
  Filled 2019-03-22: qty 1000

## 2019-03-22 MED ORDER — MIDAZOLAM HCL 2 MG/2ML IJ SOLN
INTRAMUSCULAR | Status: DC | PRN
  Administered 2019-03-22 (×2): 2 mg via INTRAVENOUS

## 2019-03-22 MED ORDER — GABAPENTIN 300 MG PO CAPS
ORAL_CAPSULE | ORAL | Status: AC
  Filled 2019-03-22: qty 1

## 2019-03-22 MED ORDER — GABAPENTIN 300 MG PO CAPS
300.0000 mg | ORAL_CAPSULE | ORAL | Status: AC
  Administered 2019-03-22: 11:00:00 300 mg via ORAL
  Filled 2019-03-22: qty 1

## 2019-03-22 MED ORDER — BUPIVACAINE HCL (PF) 0.5 % IJ SOLN
INTRAMUSCULAR | Status: DC | PRN
  Administered 2019-03-22: 20 mL

## 2019-03-22 MED ORDER — FENTANYL CITRATE (PF) 100 MCG/2ML IJ SOLN
INTRAMUSCULAR | Status: DC | PRN
  Administered 2019-03-22: 100 ug via INTRAVENOUS
  Administered 2019-03-22 (×2): 50 ug via INTRAVENOUS

## 2019-03-22 MED ORDER — ACETAMINOPHEN 500 MG PO TABS
ORAL_TABLET | ORAL | Status: AC
  Filled 2019-03-22: qty 2

## 2019-03-22 MED ORDER — LIDOCAINE 2% (20 MG/ML) 5 ML SYRINGE
INTRAMUSCULAR | Status: DC | PRN
  Administered 2019-03-22: 80 mg via INTRAVENOUS

## 2019-03-22 MED ORDER — DEXAMETHASONE SODIUM PHOSPHATE 10 MG/ML IJ SOLN
INTRAMUSCULAR | Status: DC | PRN
  Administered 2019-03-22: 10 mg via INTRAVENOUS

## 2019-03-22 MED ORDER — MIDAZOLAM HCL 2 MG/2ML IJ SOLN
INTRAMUSCULAR | Status: AC
  Filled 2019-03-22: qty 2

## 2019-03-22 MED ORDER — KETOROLAC TROMETHAMINE 30 MG/ML IJ SOLN
30.0000 mg | Freq: Once | INTRAMUSCULAR | Status: AC | PRN
  Administered 2019-03-22: 30 mg via INTRAVENOUS
  Filled 2019-03-22: qty 1

## 2019-03-22 MED ORDER — PROPOFOL 10 MG/ML IV BOLUS
INTRAVENOUS | Status: DC | PRN
  Administered 2019-03-22: 150 mg via INTRAVENOUS

## 2019-03-22 MED ORDER — CEFAZOLIN SODIUM-DEXTROSE 2-4 GM/100ML-% IV SOLN
INTRAVENOUS | Status: AC
  Filled 2019-03-22: qty 100

## 2019-03-22 MED ORDER — OXYCODONE HCL 5 MG PO TABS
5.0000 mg | ORAL_TABLET | Freq: Once | ORAL | Status: AC | PRN
  Administered 2019-03-22: 5 mg via ORAL
  Filled 2019-03-22: qty 1

## 2019-03-22 MED ORDER — ROCURONIUM BROMIDE 10 MG/ML (PF) SYRINGE
PREFILLED_SYRINGE | INTRAVENOUS | Status: AC
  Filled 2019-03-22: qty 10

## 2019-03-22 MED ORDER — PROPOFOL 10 MG/ML IV BOLUS
INTRAVENOUS | Status: AC
  Filled 2019-03-22: qty 20

## 2019-03-22 MED ORDER — OXYCODONE HCL 5 MG PO TABS
ORAL_TABLET | ORAL | Status: AC
  Filled 2019-03-22: qty 1

## 2019-03-22 SURGICAL SUPPLY — 58 items
ADH SKN CLS APL DERMABOND .7 (GAUZE/BANDAGES/DRESSINGS) ×1
APL PRP STRL LF DISP 70% ISPRP (MISCELLANEOUS) ×1
APL SKNCLS STERI-STRIP NONHPOA (GAUZE/BANDAGES/DRESSINGS) ×1
APL SWBSTK 6 STRL LF DISP (MISCELLANEOUS) ×1
APPLICATOR COTTON TIP 6 STRL (MISCELLANEOUS) ×1 IMPLANT
APPLICATOR COTTON TIP 6IN STRL (MISCELLANEOUS) ×2
APPLIER CLIP 5 13 M/L LIGAMAX5 (MISCELLANEOUS) ×2
APR CLP MED LRG 5 ANG JAW (MISCELLANEOUS) ×1
BAG RETRIEVAL 10 (BASKET)
BENZOIN TINCTURE PRP APPL 2/3 (GAUZE/BANDAGES/DRESSINGS) ×2 IMPLANT
BLADE CLIPPER SENSICLIP SURGIC (BLADE) IMPLANT
CANISTER SUCT 3000ML PPV (MISCELLANEOUS) IMPLANT
CANISTER SUCTION 1200CC (MISCELLANEOUS) IMPLANT
CHLORAPREP W/TINT 26 (MISCELLANEOUS) ×2 IMPLANT
CLIP APPLIE 5 13 M/L LIGAMAX5 (MISCELLANEOUS) ×1 IMPLANT
COVER WAND RF STERILE (DRAPES) ×2 IMPLANT
DERMABOND ADVANCED (GAUZE/BANDAGES/DRESSINGS) ×1
DERMABOND ADVANCED .7 DNX12 (GAUZE/BANDAGES/DRESSINGS) IMPLANT
DISSECTOR BLUNT TIP ENDO 5MM (MISCELLANEOUS) IMPLANT
DRAPE C-ARM 42X72 X-RAY (DRAPES) ×2 IMPLANT
DRAPE LAPAROSCOPIC ABDOMINAL (DRAPES) ×2 IMPLANT
ELECT REM PT RETURN 9FT ADLT (ELECTROSURGICAL) ×2
ELECTRODE REM PT RTRN 9FT ADLT (ELECTROSURGICAL) ×1 IMPLANT
GLOVE BIOGEL PI IND STRL 6.5 (GLOVE) IMPLANT
GLOVE BIOGEL PI IND STRL 7.0 (GLOVE) IMPLANT
GLOVE BIOGEL PI IND STRL 7.5 (GLOVE) IMPLANT
GLOVE BIOGEL PI INDICATOR 6.5 (GLOVE)
GLOVE BIOGEL PI INDICATOR 7.0 (GLOVE)
GLOVE BIOGEL PI INDICATOR 7.5 (GLOVE)
GLOVE SURG SIGNA 7.5 PF LTX (GLOVE) ×2 IMPLANT
GOWN STRL REUS W/ TWL LRG LVL3 (GOWN DISPOSABLE) ×1 IMPLANT
GOWN STRL REUS W/ TWL XL LVL3 (GOWN DISPOSABLE) ×1 IMPLANT
GOWN STRL REUS W/TWL LRG LVL3 (GOWN DISPOSABLE) ×2
GOWN STRL REUS W/TWL XL LVL3 (GOWN DISPOSABLE) ×2
KIT TURNOVER CYSTO (KITS) ×2 IMPLANT
MANIFOLD NEPTUNE II (INSTRUMENTS) IMPLANT
PACK BASIN DAY SURGERY FS (CUSTOM PROCEDURE TRAY) ×2 IMPLANT
PADDING ION DISPOSABLE (MISCELLANEOUS) ×2 IMPLANT
SCISSORS LAP 5X35 DISP (ENDOMECHANICALS) ×1 IMPLANT
SET CHOLANGIOGRAPH MIX (MISCELLANEOUS) IMPLANT
SET IRRIG TUBING LAPAROSCOPIC (IRRIGATION / IRRIGATOR) ×2 IMPLANT
SLEEVE SCD COMPRESS KNEE MED (MISCELLANEOUS) ×2 IMPLANT
SOLUTION ANTI FOG 6CC (MISCELLANEOUS) ×2 IMPLANT
STRIP CLOSURE SKIN 1/2X4 (GAUZE/BANDAGES/DRESSINGS) ×2 IMPLANT
SURGILUBE 2OZ TUBE FLIPTOP (MISCELLANEOUS) ×2 IMPLANT
SUT MON AB 4-0 PC3 18 (SUTURE) ×2 IMPLANT
SUT VICRYL 0 UR6 27IN ABS (SUTURE) IMPLANT
SYS BAG RETRIEVAL 10MM (BASKET)
SYSTEM BAG RETRIEVAL 10MM (BASKET) IMPLANT
TOWEL OR 17X26 10 PK STRL BLUE (TOWEL DISPOSABLE) ×4 IMPLANT
TRAY LAP CHOLE (CUSTOM PROCEDURE TRAY) ×2 IMPLANT
TRAY LAPAROSCOPIC (CUSTOM PROCEDURE TRAY) ×2 IMPLANT
TROCAR BLADELESS OPT 5 75 (ENDOMECHANICALS) ×8 IMPLANT
TROCAR XCEL BLADELESS 5X75MML (TROCAR) ×4 IMPLANT
TROCAR XCEL BLUNT TIP 100MML (ENDOMECHANICALS) ×2 IMPLANT
TUBE CONNECTING 12X1/4 (SUCTIONS) IMPLANT
TUBING INSUFFLATION 10FT LAP (TUBING) ×2 IMPLANT
WATER STERILE IRR 500ML POUR (IV SOLUTION) IMPLANT

## 2019-03-22 NOTE — Op Note (Signed)
Laparoscopic Cholecystectomy Procedure Note  Indications: This patient presents with symptomatic gallbladder disease and will undergo laparoscopic cholecystectomy.  Pre-operative Diagnosis: chronic cholecystitis with gallstones  Post-operative Diagnosis: Same  Surgeon: Abigail Miyamoto   Assistants: 0  Anesthesia: General endotracheal anesthesia  ASA Class: 1  Procedure Details  The patient was seen again in the Holding Room. The risks, benefits, complications, treatment options, and expected outcomes were discussed with the patient. The possibilities of reaction to medication, pulmonary aspiration, perforation of viscus, bleeding, recurrent infection, finding a normal gallbladder, the need for additional procedures, failure to diagnose a condition, the possible need to convert to an open procedure, and creating a complication requiring transfusion or operation were discussed with the patient. The likelihood of improving the patient's symptoms with return to their baseline status is good.  The patient and/or family concurred with the proposed plan, giving informed consent. The site of surgery properly noted. The patient was taken to Operating Room, identified as Farrel Conners and the procedure verified as Laparoscopic Cholecystectomy with Intraoperative Cholangiogram. A Time Out was held and the above information confirmed.  Prior to the induction of general anesthesia, antibiotic prophylaxis was administered. General endotracheal anesthesia was then administered and tolerated well. After the induction, the abdomen was prepped with Chloraprep and draped in sterile fashion. The patient was positioned in the supine position.  Local anesthetic agent was injected into the skin near the umbilicus and an incision made. We dissected down to the abdominal fascia with blunt dissection.  The fascia was incised vertically and we entered the peritoneal cavity bluntly.  A pursestring suture of 0-Vicryl was  placed around the fascial opening.  The Hasson cannula was inserted and secured with the stay suture.  Pneumoperitoneum was then created with CO2 and tolerated well without any adverse changes in the patient's vital signs. A 5-mm port was placed in the subxiphoid position.  Two 5-mm ports were placed in the right upper quadrant. All skin incisions were infiltrated with a local anesthetic agent before making the incision and placing the trocars.   We positioned the patient in reverse Trendelenburg, tilted slightly to the patient's left.  The gallbladder was identified, the fundus grasped and retracted cephalad. Adhesions were lysed bluntly and with the electrocautery where indicated, taking care not to injure any adjacent organs or viscus. The infundibulum was grasped and retracted laterally, exposing the peritoneum overlying the triangle of Calot. This was then divided and exposed in a blunt fashion. The cystic duct was clearly identified and bluntly dissected circumferentially. A critical view of the cystic duct and cystic artery was obtained.  The cystic duct was then ligated with clips and divided. The cystic artery was, dissected free, ligated with clips and divided as well.   The gallbladder was dissected from the liver bed in retrograde fashion with the electrocautery. The gallbladder was removed and placed in an Endocatch sac. The liver bed was irrigated and inspected. Hemostasis was achieved with the electrocautery. Copious irrigation was utilized and was repeatedly aspirated until clear.  The gallbladder and Endocatch sac were then removed through the umbilical port site.  The pursestring suture was used to close the umbilical fascia.    We again inspected the right upper quadrant for hemostasis.  Pneumoperitoneum was released as we removed the trocars.  4-0 Monocryl was used to close the skin.   Skin glue was then applied. The patient was then extubated and brought to the recovery room in stable  condition. Instrument, sponge, and needle  counts were correct at closure and at the conclusion of the case.   Findings: Cholecystitis with Cholelithiasis  Estimated Blood Loss: Minimal         Drains: 0         Specimens: Gallbladder           Complications: None; patient tolerated the procedure well.         Disposition: PACU - hemodynamically stable.         Condition: stable

## 2019-03-22 NOTE — Discharge Instructions (Signed)
[facility] ______[facility] LAPAROSCOPIC SURGERY: POST OP INSTRUCTIONS Always review your discharge instruction sheet given to you by the facility where your surgery was performed. IF YOU HAVE DISABILITY OR FAMILY LEAVE FORMS, YOU MUST BRING THEM TO THE OFFICE FOR PROCESSING.   DO NOT GIVE THEM TO YOUR DOCTOR.  1. A prescription for pain medication may be given to you upon discharge.  Take your pain medication as prescribed, if needed.  If narcotic pain medicine is not needed, then you may take acetaminophen (Tylenol) or ibuprofen (Advil) as needed. 2. Take your usually prescribed medications unless otherwise directed. 3. If you need a refill on your pain medication, please contact your pharmacy.  They will contact our office to request authorization. Prescriptions will not be filled after 5pm or on week-ends. 4. You should follow a light diet the first few days after arrival home, such as soup and crackers, etc.  Be sure to include lots of fluids daily. 5. Most patients will experience some swelling and bruising in the area of the incisions.  Ice packs will help.  Swelling and bruising can take several days to resolve.  6. It is common to experience some constipation if taking pain medication after surgery.  Increasing fluid intake and taking a stool softener (such as Colace) will usually help or prevent this problem from occurring.  A mild laxative (Milk of Magnesia or Miralax) should be taken according to package instructions if there are no bowel movements after 48 hours. 7. Unless discharge instructions indicate otherwise, you may remove your bandages 24-48 hours after surgery, and you may shower at that time.  You may have steri-strips (small skin tapes) in place directly over the incision.  These strips should be left on the skin for 7-10 days.  If your surgeon used skin glue on the incision, you may shower in 24 hours.  The glue will flake off over the next 2-3 weeks.  Any sutures or  staples will be removed at the office during your follow-up visit. 8. ACTIVITIES:  You may resume regular (light) daily activities beginning the next day--such as daily self-care, walking, climbing stairs--gradually increasing activities as tolerated.  You may have sexual intercourse when it is comfortable.  Refrain from any heavy lifting or straining until approved by your doctor. a. You may drive when you are no longer taking prescription pain medication, you can comfortably wear a seatbelt, and you can safely maneuver your car and apply brakes. b. RETURN TO WORK:  __________________________________________________________ 9. You should see your doctor in the office for a follow-up appointment approximately 2-3 weeks after your surgery.  Make sure that you call for this appointment within a day or two after you arrive home to insure a convenient appointment time. 10. OTHER INSTRUCTIONS:OK TO SHOWER STARTING TOMORROW 11. ICE PACK, TYLENOL, IBUPROFEN ALSO FOR PAIN 12. NO LIFTING MORE THAN 15 TO 20 POUNDS FOR 2 WEEKS __________________________________________________________________________________________________________________________ __________________________________________________________________________________________________________________________ WHEN TO CALL YOUR DOCTOR: 1. Fever over 101.0 2. Inability to urinate 3. Continued bleeding from incision. 4. Increased pain, redness, or drainage from the incision. 5. Increasing abdominal pain  The clinic staff is available to answer your questions during regular business hours.  Please don't hesitate to call and ask to speak to one of the nurses for clinical concerns.  If you have a medical emergency, go to the nearest emergency room or call 911.  A surgeon from [facility] is always on call at the hospital. [address], [address], [place], [place]  [address] ? [address] [address], , [place]   [address] [phone] ?  [phone] ? FAX [phone] Web site: [email or web address]   Post Anesthesia Home Care Instructions  Activity: Get plenty of rest for the remainder of the day. A responsible individual must stay with you for 24 hours following the procedure.  For the next 24 hours, DO NOT: -Drive a car -Operate machinery -Drink alcoholic beverages -Take any medication unless instructed by your physician -Make any legal decisions or sign important papers.  Meals: Start with liquid foods such as gelatin or soup. Progress to regular foods as tolerated. Avoid greasy, spicy, heavy foods. If nausea and/or vomiting occur, drink only clear liquids until the nausea and/or vomiting subsides. Call your physician if vomiting continues.  Special Instructions/Symptoms: Your throat may feel dry or sore from the anesthesia or the breathing tube placed in your throat during surgery. If this causes discomfort, gargle with warm salt water. The discomfort should disappear within 24 hours.  If you had a scopolamine patch placed behind your ear for the management of post- operative nausea and/or vomiting:  1. The medication in the patch is effective for 72 hours, after which it should be removed.  Wrap patch in a tissue and discard in the trash. Wash hands thoroughly with soap and water. 2. You may remove the patch earlier than 72 hours if you experience unpleasant side effects which may include dry mouth, dizziness or visual disturbances. 3. Avoid touching the patch. Wash your hands with soap and water after contact with the patch.   

## 2019-03-22 NOTE — Transfer of Care (Signed)
Immediate Anesthesia Transfer of Care Note  Patient: Wanda Stafford  Procedure(s) Performed: LAPAROSCOPIC CHOLECYSTECTOMY (N/A )  Patient Location: PACU  Anesthesia Type:General  Level of Consciousness: awake, alert , oriented and patient cooperative  Airway & Oxygen Therapy: Patient Spontanous Breathing and Patient connected to nasal cannula oxygen  Post-op Assessment: Report given to RN and Post -op Vital signs reviewed and stable  Post vital signs: Reviewed and stable  Last Vitals:  Vitals Value Taken Time  BP 141/75 03/22/19 1358  Temp    Pulse 78 03/22/19 1400  Resp 15 03/22/19 1400  SpO2 100 % 03/22/19 1400  Vitals shown include unvalidated device data.  Last Pain:  Vitals:   03/22/19 1110  TempSrc: Oral         Complications: No apparent anesthesia complications

## 2019-03-22 NOTE — Anesthesia Procedure Notes (Signed)
Procedure Name: Intubation Date/Time: 03/22/2019 1:03 PM Performed by: Wanita Chamberlain, CRNA Pre-anesthesia Checklist: Patient identified, Timeout performed, Emergency Drugs available, Suction available and Patient being monitored Patient Re-evaluated:Patient Re-evaluated prior to induction Oxygen Delivery Method: Circle system utilized Preoxygenation: Pre-oxygenation with 100% oxygen Induction Type: IV induction Ventilation: Mask ventilation without difficulty Laryngoscope Size: Mac and 3 Grade View: Grade I Tube type: Oral Tube size: 7.0 mm Number of attempts: 1 Airway Equipment and Method: Stylet Placement Confirmation: positive ETCO2,  CO2 detector,  breath sounds checked- equal and bilateral and ETT inserted through vocal cords under direct vision Secured at: 21 cm Tube secured with: Tape Dental Injury: Teeth and Oropharynx as per pre-operative assessment

## 2019-03-22 NOTE — Interval H&P Note (Signed)
History and Physical Interval Note: no change in H and P  03/22/2019 12:16 PM  Wanda Stafford  has presented today for surgery, with the diagnosis of SYMPTOMATIC CHOLELITHIASIS.  The various methods of treatment have been discussed with the patient and family. After consideration of risks, benefits and other options for treatment, the patient has consented to  Procedure(s): LAPAROSCOPIC CHOLECYSTECTOMY (N/A) as a surgical intervention.  The patient's history has been reviewed, patient examined, no change in status, stable for surgery.  I have reviewed the patient's chart and labs.  Questions were answered to the patient's satisfaction.     Abigail Miyamoto

## 2019-03-23 LAB — SURGICAL PATHOLOGY

## 2019-03-23 NOTE — Anesthesia Postprocedure Evaluation (Signed)
Anesthesia Post Note  Patient: Wanda Stafford  Procedure(s) Performed: LAPAROSCOPIC CHOLECYSTECTOMY (N/A )     Patient location during evaluation: PACU Anesthesia Type: General Level of consciousness: awake and alert Pain management: pain level controlled Vital Signs Assessment: post-procedure vital signs reviewed and stable Respiratory status: spontaneous breathing, nonlabored ventilation, respiratory function stable and patient connected to nasal cannula oxygen Cardiovascular status: blood pressure returned to baseline and stable Postop Assessment: no apparent nausea or vomiting Anesthetic complications: no    Last Vitals:  Vitals:   03/22/19 1430 03/22/19 1530  BP: 126/82 119/75  Pulse: 66 75  Resp: 20 18  Temp:    SpO2: 97% 100%    Last Pain:  Vitals:   03/22/19 1530  TempSrc:   PainSc: 4    Pain Goal:                   Trevor Iha

## 2019-04-25 DIAGNOSIS — R0989 Other specified symptoms and signs involving the circulatory and respiratory systems: Secondary | ICD-10-CM | POA: Diagnosis not present

## 2019-04-25 DIAGNOSIS — R5383 Other fatigue: Secondary | ICD-10-CM | POA: Diagnosis not present

## 2019-04-25 DIAGNOSIS — Z20822 Contact with and (suspected) exposure to covid-19: Secondary | ICD-10-CM | POA: Diagnosis not present

## 2019-04-25 DIAGNOSIS — R0981 Nasal congestion: Secondary | ICD-10-CM | POA: Diagnosis not present

## 2019-05-18 ENCOUNTER — Ambulatory Visit: Payer: BC Managed Care – PPO | Attending: Internal Medicine

## 2019-05-18 DIAGNOSIS — Z23 Encounter for immunization: Secondary | ICD-10-CM

## 2019-05-18 NOTE — Progress Notes (Signed)
   Covid-19 Vaccination Clinic  Name:  ANNALYCIA DONE    MRN: [id number] DOB: [birth year]  05/18/2019  Ms. Porto was observed post Covid-19 immunization for 15 minutes without incident. She was provided with Vaccine Information Sheet and instruction to access the V-Safe system.   Ms. Lahm was instructed to call 911 with any severe reactions post vaccine: Marland Kitchen Difficulty breathing  . Swelling of face and throat  . A fast heartbeat  . A bad rash all over body  . Dizziness and weakness   Immunizations Administered    Name Date Dose VIS Date Route   Pfizer COVID-19 Vaccine 05/18/2019  3:48 PM 0.3 mL 02/24/2019 Intramuscular   Manufacturer: [employer], [employer]   Lot: [id number]   NDC: [address]-1

## 2019-06-13 DIAGNOSIS — F339 Major depressive disorder, recurrent, unspecified: Secondary | ICD-10-CM | POA: Diagnosis not present

## 2019-06-13 DIAGNOSIS — F419 Anxiety disorder, unspecified: Secondary | ICD-10-CM | POA: Diagnosis not present

## 2019-10-02 DIAGNOSIS — R635 Abnormal weight gain: Secondary | ICD-10-CM | POA: Diagnosis not present

## 2019-10-02 DIAGNOSIS — Z0000 Encounter for general adult medical examination without abnormal findings: Secondary | ICD-10-CM | POA: Diagnosis not present

## 2019-10-02 DIAGNOSIS — Z13220 Encounter for screening for lipoid disorders: Secondary | ICD-10-CM | POA: Diagnosis not present

## 2019-10-02 DIAGNOSIS — E559 Vitamin D deficiency, unspecified: Secondary | ICD-10-CM | POA: Diagnosis not present

## 2019-10-02 DIAGNOSIS — F419 Anxiety disorder, unspecified: Secondary | ICD-10-CM | POA: Diagnosis not present

## 2019-10-02 DIAGNOSIS — F339 Major depressive disorder, recurrent, unspecified: Secondary | ICD-10-CM | POA: Diagnosis not present

## 2019-11-29 DIAGNOSIS — F329 Major depressive disorder, single episode, unspecified: Secondary | ICD-10-CM | POA: Diagnosis not present

## 2019-12-06 DIAGNOSIS — F329 Major depressive disorder, single episode, unspecified: Secondary | ICD-10-CM | POA: Diagnosis not present

## 2019-12-14 DIAGNOSIS — F329 Major depressive disorder, single episode, unspecified: Secondary | ICD-10-CM | POA: Diagnosis not present

## 2019-12-21 DIAGNOSIS — F329 Major depressive disorder, single episode, unspecified: Secondary | ICD-10-CM | POA: Diagnosis not present

## 2019-12-28 DIAGNOSIS — F329 Major depressive disorder, single episode, unspecified: Secondary | ICD-10-CM | POA: Diagnosis not present

## 2020-01-31 DIAGNOSIS — Z130 Encounter for screening for diseases of the blood and blood-forming organs and certain disorders involving the immune mechanism: Secondary | ICD-10-CM | POA: Diagnosis not present

## 2020-01-31 DIAGNOSIS — Z01419 Encounter for gynecological examination (general) (routine) without abnormal findings: Secondary | ICD-10-CM | POA: Diagnosis not present

## 2020-01-31 DIAGNOSIS — Z6824 Body mass index (BMI) 24.0-24.9, adult: Secondary | ICD-10-CM | POA: Diagnosis not present

## 2020-01-31 DIAGNOSIS — Z1389 Encounter for screening for other disorder: Secondary | ICD-10-CM | POA: Diagnosis not present

## 2020-02-20 DIAGNOSIS — Z79891 Long term (current) use of opiate analgesic: Secondary | ICD-10-CM | POA: Diagnosis not present

## 2020-02-20 DIAGNOSIS — F411 Generalized anxiety disorder: Secondary | ICD-10-CM | POA: Diagnosis not present

## 2020-02-20 DIAGNOSIS — F329 Major depressive disorder, single episode, unspecified: Secondary | ICD-10-CM | POA: Diagnosis not present

## 2020-02-21 DIAGNOSIS — F411 Generalized anxiety disorder: Secondary | ICD-10-CM | POA: Diagnosis not present

## 2020-03-28 DIAGNOSIS — F329 Major depressive disorder, single episode, unspecified: Secondary | ICD-10-CM | POA: Diagnosis not present

## 2020-03-28 DIAGNOSIS — F411 Generalized anxiety disorder: Secondary | ICD-10-CM | POA: Diagnosis not present

## 2020-04-17 DIAGNOSIS — F329 Major depressive disorder, single episode, unspecified: Secondary | ICD-10-CM | POA: Diagnosis not present

## 2020-04-17 DIAGNOSIS — F411 Generalized anxiety disorder: Secondary | ICD-10-CM | POA: Diagnosis not present

## 2020-05-14 DIAGNOSIS — F411 Generalized anxiety disorder: Secondary | ICD-10-CM | POA: Diagnosis not present

## 2020-05-14 DIAGNOSIS — F329 Major depressive disorder, single episode, unspecified: Secondary | ICD-10-CM | POA: Diagnosis not present

## 2020-06-18 DIAGNOSIS — F411 Generalized anxiety disorder: Secondary | ICD-10-CM | POA: Diagnosis not present

## 2020-06-18 DIAGNOSIS — F329 Major depressive disorder, single episode, unspecified: Secondary | ICD-10-CM | POA: Diagnosis not present

## 2020-08-28 DIAGNOSIS — L814 Other melanin hyperpigmentation: Secondary | ICD-10-CM | POA: Diagnosis not present

## 2020-08-28 DIAGNOSIS — D225 Melanocytic nevi of trunk: Secondary | ICD-10-CM | POA: Diagnosis not present

## 2020-09-17 DIAGNOSIS — F411 Generalized anxiety disorder: Secondary | ICD-10-CM | POA: Diagnosis not present

## 2020-09-17 DIAGNOSIS — F329 Major depressive disorder, single episode, unspecified: Secondary | ICD-10-CM | POA: Diagnosis not present

## 2020-10-09 DIAGNOSIS — Z0000 Encounter for general adult medical examination without abnormal findings: Secondary | ICD-10-CM | POA: Diagnosis not present

## 2020-10-09 DIAGNOSIS — Z13220 Encounter for screening for lipoid disorders: Secondary | ICD-10-CM | POA: Diagnosis not present

## 2020-10-09 DIAGNOSIS — E559 Vitamin D deficiency, unspecified: Secondary | ICD-10-CM | POA: Diagnosis not present

## 2020-10-17 DIAGNOSIS — F329 Major depressive disorder, single episode, unspecified: Secondary | ICD-10-CM | POA: Diagnosis not present

## 2020-10-17 DIAGNOSIS — F411 Generalized anxiety disorder: Secondary | ICD-10-CM | POA: Diagnosis not present

## 2020-11-13 DIAGNOSIS — Z01818 Encounter for other preprocedural examination: Secondary | ICD-10-CM | POA: Diagnosis not present

## 2020-11-15 DIAGNOSIS — K635 Polyp of colon: Secondary | ICD-10-CM | POA: Diagnosis not present

## 2020-11-15 DIAGNOSIS — Z800 Family history of malignant neoplasm of digestive organs: Secondary | ICD-10-CM | POA: Diagnosis not present

## 2020-11-15 DIAGNOSIS — K640 First degree hemorrhoids: Secondary | ICD-10-CM | POA: Diagnosis not present

## 2020-11-15 DIAGNOSIS — Z1211 Encounter for screening for malignant neoplasm of colon: Secondary | ICD-10-CM | POA: Diagnosis not present

## 2021-03-29 ENCOUNTER — Other Ambulatory Visit: Payer: Self-pay | Admitting: Obstetrics and Gynecology

## 2021-03-29 DIAGNOSIS — R928 Other abnormal and inconclusive findings on diagnostic imaging of breast: Secondary | ICD-10-CM

## 2021-04-21 ENCOUNTER — Other Ambulatory Visit: Payer: Self-pay | Admitting: Obstetrics and Gynecology

## 2021-04-21 ENCOUNTER — Ambulatory Visit
Admission: RE | Admit: 2021-04-21 | Discharge: 2021-04-21 | Disposition: A | Discharge status: Home / Self Care | Payer: PRIVATE HEALTH INSURANCE | Source: Ambulatory Visit | Attending: Obstetrics and Gynecology | Admitting: Obstetrics and Gynecology

## 2021-04-21 ENCOUNTER — Ambulatory Visit
Admission: RE | Admit: 2021-04-21 | Discharge: 2021-04-21 | Disposition: A | Discharge status: Home / Self Care | Payer: BC Managed Care – PPO | Source: Ambulatory Visit | Attending: Obstetrics and Gynecology | Admitting: Obstetrics and Gynecology

## 2021-04-21 DIAGNOSIS — R921 Mammographic calcification found on diagnostic imaging of breast: Secondary | ICD-10-CM

## 2021-04-21 DIAGNOSIS — R928 Other abnormal and inconclusive findings on diagnostic imaging of breast: Secondary | ICD-10-CM

## 2021-10-21 ENCOUNTER — Other Ambulatory Visit: Payer: Self-pay

## 2021-10-29 ENCOUNTER — Other Ambulatory Visit: Payer: Self-pay | Admitting: Obstetrics and Gynecology

## 2021-10-29 ENCOUNTER — Ambulatory Visit
Admission: RE | Admit: 2021-10-29 | Discharge: 2021-10-29 | Disposition: A | Discharge status: Home / Self Care | Payer: Managed Care, Other (non HMO) | Source: Ambulatory Visit | Attending: Obstetrics and Gynecology | Admitting: Obstetrics and Gynecology

## 2021-10-29 ENCOUNTER — Ambulatory Visit: Admission: RE | Admit: 2021-10-29 | Payer: Self-pay | Source: Ambulatory Visit

## 2021-10-29 DIAGNOSIS — R921 Mammographic calcification found on diagnostic imaging of breast: Secondary | ICD-10-CM

## 2021-10-29 DIAGNOSIS — R928 Other abnormal and inconclusive findings on diagnostic imaging of breast: Secondary | ICD-10-CM

## 2022-04-22 NOTE — Progress Notes (Unsigned)
New Patient Note  RE: Wanda Stafford MRN: [id number] DOB: [birth year] Date of Office Visit: 04/23/2022  Consult requested by: Paula Compton, MD Primary care provider: Paula Compton, MD  Chief Complaint: No chief complaint on file.  History of Present Illness: I had the pleasure of seeing Wanda Stafford for initial evaluation at the [facility] on 04/22/2022. She is a 42 y.o. female, who is referred here by Paula Compton, MD for the evaluation of chronic rhinitis.  She reports symptoms of ***. Symptoms have been going on for *** years. The symptoms are present *** all year around with worsening in ***. Other triggers include exposure to ***. Anosmia: ***. Headache: ***. She has used *** with ***fair improvement in symptoms. Sinus infections: ***. Previous work up includes: ***. Previous ENT evaluation: ***. Previous sinus imaging: ***. History of nasal polyps: ***. Last eye exam: ***. History of reflux: ***.  Assessment and Plan: Wanda Stafford is a 42 y.o. female with: No problem-specific Assessment & Plan notes found for this encounter.  No follow-ups on file.  No orders of the defined types were placed in this encounter.  Lab Orders  No laboratory test(s) ordered today    Other allergy screening: Asthma: {Blank single:19197::"yes","no"} Rhino conjunctivitis: {Blank single:19197::"yes","no"} Food allergy: {Blank single:19197::"yes","no"} Medication allergy: {Blank single:19197::"yes","no"} Hymenoptera allergy: {Blank single:19197::"yes","no"} Urticaria: {Blank single:19197::"yes","no"} Eczema:{Blank single:19197::"yes","no"} History of recurrent infections suggestive of immunodeficency: {Blank single:19197::"yes","no"}  Diagnostics: Spirometry:  Tracings reviewed. Her effort: {Blank single:19197::"Good reproducible efforts.","It was hard to get consistent efforts and there is a question as to whether this reflects a maximal maneuver.","Poor effort,  data can not be interpreted."} FVC: ***L FEV1: ***L, ***% predicted FEV1/FVC ratio: ***% Interpretation: {Blank single:19197::"Spirometry consistent with mild obstructive disease","Spirometry consistent with moderate obstructive disease","Spirometry consistent with severe obstructive disease","Spirometry consistent with possible restrictive disease","Spirometry consistent with mixed obstructive and restrictive disease","Spirometry uninterpretable due to technique","Spirometry consistent with normal pattern","No overt abnormalities noted given today's efforts"}.  Please see scanned spirometry results for details.  Skin Testing: {Blank single:19197::"Select foods","Environmental allergy panel","Environmental allergy panel and select foods","Food allergy panel","None","Deferred due to recent antihistamines use"}. *** Results discussed with patient/family.   Past Medical History: Patient Active Problem List   Diagnosis Date Noted  . NSVD (normal spontaneous vaginal delivery) 11/09/2014  . PROM (premature rupture of membranes) 11/08/2014  . Chest pain 03/27/2013   Past Medical History:  Diagnosis Date  . GERD (gastroesophageal reflux disease)    03-16-2019 per pt occasional due to gallbladder, no meds  . History of abnormal cervical Pap smear    03/ 2006  s/p LEEP  . PONV (postoperative nausea and vomiting)   . Symptomatic cholelithiasis    Past Surgical History: Past Surgical History:  Procedure Laterality Date  . AUGMENTATION MAMMAPLASTY Bilateral    2003  . BREAST ENHANCEMENT SURGERY Bilateral 2003  . CERVICAL BIOPSY  W/ LOOP ELECTRODE EXCISION  05/2004  . CHOLECYSTECTOMY N/A 03/22/2019   Procedure: LAPAROSCOPIC CHOLECYSTECTOMY;  Surgeon: Coralie Keens, MD;  Location: [facility];  Service: General;  Laterality: N/A;   Medication List:  Current Outpatient Medications  Medication Sig Dispense Refill  . ALPRAZolam (XANAX) 0.5 MG tablet Take 0.5 mg by mouth at  bedtime as needed for anxiety (when flying).    . APPLE CIDER VINEGAR PO Take by mouth daily.    Marland Kitchen ibuprofen (ADVIL) 200 MG tablet Take 200 mg by mouth every 6 (six) hours as needed.    Marland Kitchen levonorgestrel-ethinyl estradiol (VIENVA) 0.1-20 MG-MCG  tablet Take 1 tablet by mouth at bedtime.    Marland Kitchen loratadine (CLARITIN) 10 MG tablet Take 10 mg by mouth at bedtime.    Marland Kitchen oxyCODONE-acetaminophen (PERCOCET/ROXICET) 5-325 MG tablet Take 1 tablet by mouth every 6 (six) hours as needed for severe pain. 30 tablet 0  . sertraline (ZOLOFT) 50 MG tablet Take 25 mg by mouth at bedtime. 03-16-2019  Per pt is weaning off medication     No current facility-administered medications for this visit.   Allergies: No Known Allergies Social History: Social History   Socioeconomic History  . Marital status: Married    Spouse name: Not on file  . Number of children: Not on file  . Years of education: Not on file  . Highest education level: Not on file  Occupational History  . Not on file  Tobacco Use  . Smoking status: Never  . Smokeless tobacco: Never  Vaping Use  . Vaping Use: Never used  Substance and Sexual Activity  . Alcohol use: Yes    Comment: occasional  . Drug use: Never  . Sexual activity: Yes    Birth control/protection: Pill  Other Topics Concern  . Not on file  Social History Narrative  . Not on file   Social Determinants of Health   Financial Resource Strain: Not on file  Food Insecurity: Not on file  Transportation Needs: Not on file  Physical Activity: Not on file  Stress: Not on file  Social Connections: Not on file   Lives in a ***. Smoking: *** Occupation: ***  Environmental HistoryFreight forwarder in the house: Estate agent in the family room: {Blank single:19197::"yes","no"} Carpet in the bedroom: {Blank single:19197::"yes","no"} Heating: {Blank single:19197::"electric","gas","heat pump"} Cooling: {Blank  single:19197::"central","window","heat pump"} Pet: {Blank single:19197::"yes ***","no"}  Family History: Family History  Problem Relation Age of Onset  . Arrhythmia Mother   . Heart murmur Brother    Problem                               Relation Asthma                                   *** Eczema                                *** Food allergy                          *** Allergic rhino conjunctivitis     ***  Review of Systems  Constitutional:  Negative for appetite change, chills, fever and unexpected weight change.  HENT:  Negative for congestion and rhinorrhea.   Eyes:  Negative for itching.  Respiratory:  Negative for cough, chest tightness, shortness of breath and wheezing.   Cardiovascular:  Negative for chest pain.  Gastrointestinal:  Negative for abdominal pain.  Genitourinary:  Negative for difficulty urinating.  Skin:  Negative for rash.  Neurological:  Negative for headaches.   Objective: There were no vitals taken for this visit. There is no height or weight on file to calculate BMI. Physical Exam Vitals and nursing note reviewed.  Constitutional:      Appearance: Normal appearance. She is well-developed.  HENT:     Head: Normocephalic and atraumatic.     Right Ear:  Tympanic membrane and external ear normal.     Left Ear: Tympanic membrane and external ear normal.     Nose: Nose normal.     Mouth/Throat:     Mouth: Mucous membranes are moist.     Pharynx: Oropharynx is clear.  Eyes:     Conjunctiva/sclera: Conjunctivae normal.  Cardiovascular:     Rate and Rhythm: Normal rate and regular rhythm.     Heart sounds: Normal heart sounds. No murmur heard.    No friction rub. No gallop.  Pulmonary:     Effort: Pulmonary effort is normal.     Breath sounds: Normal breath sounds. No wheezing, rhonchi or rales.  Musculoskeletal:     Cervical back: Neck supple.  Skin:    General: Skin is warm.     Findings: No rash.  Neurological:     Mental Status: She is  alert and oriented to person, place, and time.  Psychiatric:        Behavior: Behavior normal.  The plan was reviewed with the patient/family, and all questions/concerned were addressed.  It was my pleasure to see Yania today and participate in her care. Please feel free to contact me with any questions or concerns.  Sincerely,  Rexene Alberts, DO Allergy & Immunology  [facility] office: [place] office: [phone]

## 2022-04-23 ENCOUNTER — Encounter: Payer: Self-pay | Admitting: Allergy

## 2022-04-23 ENCOUNTER — Ambulatory Visit: Payer: Managed Care, Other (non HMO) | Admitting: Allergy

## 2022-04-23 ENCOUNTER — Other Ambulatory Visit: Payer: Self-pay

## 2022-04-23 VITALS — BP 118/74 | HR 81 | Temp 98.0°F | Resp 18 | Ht 66.0 in | Wt 152.0 lb

## 2022-04-23 DIAGNOSIS — H1013 Acute atopic conjunctivitis, bilateral: Secondary | ICD-10-CM | POA: Insufficient documentation

## 2022-04-23 DIAGNOSIS — J3089 Other allergic rhinitis: Secondary | ICD-10-CM

## 2022-04-23 MED ORDER — RYALTRIS 665-25 MCG/ACT NA SUSP: 1.0000 | Freq: Two times a day (BID) | NASAL | 5 refills | Status: AC

## 2022-04-23 NOTE — Assessment & Plan Note (Addendum)
Worsening rhino conjunctivitis symptoms which flare in the spring and fall. Tried Claritin, zyrtec and Flonase with some benefit. No prior allergy testing. Going to see ENT next. Symptoms worse since off antihistamines. Today's skin testing showed: Positive to grass, weed, ragweed, mold, dust mites, cat, cockroach. Start environmental control measures as below. Use over the counter antihistamines such as Zyrtec (cetirizine), Claritin (loratadine), Allegra (fexofenadine), or Xyzal (levocetirizine) daily as needed. May take twice a day during allergy flares. May switch antihistamines every few months. Start Ryaltris (olopatadine + mometasone nasal spray combination) 1-2 sprays per nostril twice a day. Sample given. This replaces your other nasal sprays. If this works well for you, then have [facility] ship the medication to your home - prescription already sent in.  Nasal saline spray (i.e., Simply Saline) or nasal saline lavage (i.e., NeilMed) is recommended as needed and prior to medicated nasal sprays. Consider allergy injections for long term control if above medications do not help the symptoms - handout given.  Let us know if ready to start.  Follow up with ENT as scheduled.

## 2022-04-23 NOTE — Assessment & Plan Note (Addendum)
See assessment and plan as above. Declined eye drops. 

## 2022-04-23 NOTE — Patient Instructions (Addendum)
Today's skin testing showed: Positive to grass, weed, ragweed, mold, dust mites, cat, cockroach.  Results given.  Environmental allergies Start environmental control measures as below. Use over the counter antihistamines such as Zyrtec (cetirizine), Claritin (loratadine), Allegra (fexofenadine), or Xyzal (levocetirizine) daily as needed. May take twice a day during allergy flares. May switch antihistamines every few months. Start Ryaltris (olopatadine + mometasone nasal spray combination) 1-2 sprays per nostril twice a day. Sample given. This replaces your other nasal sprays. If this works well for you, then have [facility] ship the medication to your home - prescription already sent in.  Nasal saline spray (i.e., Simply Saline) or nasal saline lavage (i.e., NeilMed) is recommended as needed and prior to medicated nasal sprays. Consider allergy injections for long term control if above medications do not help the symptoms - handout given.  Let us know if ready to start.  Follow up with ENT as scheduled.   Follow up in 4-6 months or sooner if needed.    Reducing Pollen Exposure Pollen seasons: trees (spring), grass (summer) and ragweed/weeds (fall). Keep windows closed in your home and car to lower pollen exposure.  Install air conditioning in the bedroom and throughout the house if possible.  Avoid going out in dry windy days - especially early morning. Pollen counts are highest between 5 - 10 AM and on dry, hot and windy days.  Save outside activities for late afternoon or after a heavy rain, when pollen levels are lower.  Avoid mowing of grass if you have grass pollen allergy. Be aware that pollen can also be transported indoors on people and pets.  Dry your clothes in an automatic dryer rather than hanging them outside where they might collect pollen.  Rinse hair and eyes before bedtime. Mold Control Mold and fungi can grow on a variety of surfaces provided certain temperature and  moisture conditions exist.  Outdoor molds grow on plants, decaying vegetation and soil. The major outdoor mold, Alternaria and Cladosporium, are found in very high numbers during hot and dry conditions. Generally, a late summer - fall peak is seen for common outdoor fungal spores. Rain will temporarily lower outdoor mold spore count, but counts rise rapidly when the rainy period ends. The most important indoor molds are Aspergillus and Penicillium. Dark, humid and poorly ventilated basements are ideal sites for mold growth. The next most common sites of mold growth are the bathroom and the kitchen. Outdoor (Seasonal) Mold Control Use air conditioning and keep windows closed. Avoid exposure to decaying vegetation. Avoid leaf raking. Avoid grain handling. Consider wearing a face mask if working in moldy areas.  Indoor (Perennial) Mold Control  Maintain humidity below 50%. Get rid of mold growth on hard surfaces with water, detergent and, if necessary, 5% bleach (do not mix with other cleaners). Then dry the area completely. If mold covers an area more than 10 square feet, consider hiring an indoor environmental professional. For clothing, washing with soap and water is best. If moldy items cannot be cleaned and dried, throw them away. Remove sources e.g. contaminated carpets. Repair and seal leaking roofs or pipes. Using dehumidifiers in damp basements may be helpful, but empty the water and clean units regularly to prevent mildew from forming. All rooms, especially basements, bathrooms and kitchens, require ventilation and cleaning to deter mold and mildew growth. Avoid carpeting on concrete or damp floors, and storing items in damp areas. Control of House Dust Mite Allergen Dust mite allergens are a common trigger of allergy  and asthma symptoms. While they can be found throughout the house, these microscopic creatures thrive in warm, humid environments such as bedding, upholstered furniture and  carpeting. Because so much time is spent in the bedroom, it is essential to reduce mite levels there.  Encase pillows, mattresses, and box springs in special allergen-proof fabric covers or airtight, zippered plastic covers.  Bedding should be washed weekly in hot water (130 F) and dried in a hot dryer. Allergen-proof covers are available for comforters and pillows that can't be regularly washed.  Wash the allergy-proof covers every few months. Minimize clutter in the bedroom. Keep pets out of the bedroom.  Keep humidity less than 50% by using a dehumidifier or air conditioning. You can buy a humidity measuring device called a hygrometer to monitor this.  If possible, replace carpets with hardwood, linoleum, or washable area rugs. If that's not possible, vacuum frequently with a vacuum that has a HEPA filter. Remove all upholstered furniture and non-washable window drapes from the bedroom. Remove all non-washable stuffed toys from the bedroom.  Wash stuffed toys weekly. Pet Allergen Avoidance: Contrary to popular opinion, there are no "hypoallergenic" breeds of dogs or cats. That is because people are not allergic to an animal's hair, but to an allergen found in the animal's saliva, dander (dead skin flakes) or urine. Pet allergy symptoms typically occur within minutes. For some people, symptoms can build up and become most severe 8 to 12 hours after contact with the animal. People with severe allergies can experience reactions in public places if dander has been transported on the pet owners' clothing. Keeping an animal outdoors is only a partial solution, since homes with pets in the yard still have higher concentrations of animal allergens. Before getting a pet, ask your allergist to determine if you are allergic to animals. If your pet is already considered part of your family, try to minimize contact and keep the pet out of the bedroom and other rooms where you spend a great deal of time. As with  dust mites, vacuum carpets often or replace carpet with a hardwood floor, tile or linoleum. High-efficiency particulate air (HEPA) cleaners can reduce allergen levels over time. While dander and saliva are the source of cat and dog allergens, urine is the source of allergens from rabbits, hamsters, mice and [place] pigs; so ask a non-allergic family member to clean the animal's cage. If you have a pet allergy, talk to your allergist about the potential for allergy immunotherapy (allergy shots). This strategy can often provide long-term relief. Cockroach Allergen Avoidance Cockroaches are often found in the homes of densely populated urban areas, schools or commercial buildings, but these creatures can lurk almost anywhere. This does not mean that you have a dirty house or living area. Block all areas where roaches can enter the home. This includes crevices, wall cracks and windows.  Cockroaches need water to survive, so fix and seal all leaky faucets and pipes. Have an exterminator go through the house when your family and pets are gone to eliminate any remaining roaches. Keep food in lidded containers and put pet food dishes away after your pets are done eating. Vacuum and sweep the floor after meals, and take out garbage and recyclables. Use lidded garbage containers in the kitchen. Wash dishes immediately after use and clean under stoves, refrigerators or toasters where crumbs can accumulate. Wipe off the stove and other kitchen surfaces and cupboards regularly.

## 2022-04-28 ENCOUNTER — Other Ambulatory Visit: Payer: Self-pay | Admitting: Allergy

## 2022-04-28 DIAGNOSIS — J3089 Other allergic rhinitis: Secondary | ICD-10-CM

## 2022-04-29 DIAGNOSIS — J302 Other seasonal allergic rhinitis: Secondary | ICD-10-CM

## 2022-04-29 NOTE — Progress Notes (Signed)
Aeroallergen Immunotherapy   Ordering Provider: Dr. Rexene Alberts   Patient Details  Name: Wanda Stafford  MRN: [id number]  Date of Birth: 1980/11/01   Order 1 of 2   Vial Label: G-Rw-W   0.3 ml (Volume)  BAU Concentration -- 7 Grass Mix* 100,000 ([address] [place], [place], [place], [place], [place], [place], [place])  0.3 ml (Volume)  BAU Concentration -- [place] 10,000  0.2 ml (Volume)  1:20 Concentration -- [place]  0.3 ml (Volume)  1:20 Concentration -- Ragweed Mix  0.5 ml (Volume)  1:20 Concentration -- Weed Mix*    1.6  ml Extract Subtotal  3.4  ml Diluent  5.0  ml Maintenance Total   Schedule:  B  Blue Vial (1:100,000): Schedule B (6 doses)  Yellow Vial (1:10,000): Schedule B (6 doses)  Green Vial (1:1,000): Schedule B (6 doses)  Red Vial (1:100): Schedule A (14 doses)   Special Instructions: 1-2 times per week during build up.

## 2022-04-29 NOTE — Progress Notes (Signed)
Aeroallergen Immunotherapy  Ordering Provider: Dr. Rexene Alberts  Patient Details Name: Wanda Stafford MRN: [id number] Date of Birth: Aug 29, 1980  Order 2 of 2  Vial Label: M-C-Dm  0.2 ml (Volume)  1:10 Concentration -- Aspergillus mix 0.2 ml (Volume)  1:10 Concentration -- Penicillium mix 0.2 ml (Volume)  1:10 Concentration -- Fusarium moniliforme 0.2 ml (Volume)  1:40 Concentration -- Aureobasidium pullulans 0.2 ml (Volume)  1:10 Concentration -- Rhizopus oryzae 0.5 ml (Volume)  1:10 Concentration -- Cat Hair 0.5 ml (Volume)   AU Concentration -- Mite Mix (DF 5,000 & DP 5,000)   2.0  ml Extract Subtotal 3.0  ml Diluent 5.0  ml Maintenance Total  Schedule:  B Blue Vial (1:100,000): Schedule B (6 doses) Yellow Vial (1:10,000): Schedule B (6 doses) Green Vial (1:1,000): Schedule B (6 doses) Red Vial (1:100): Schedule A (14 doses)  Special Instructions: 1-2 times per week during build up.

## 2022-04-29 NOTE — Progress Notes (Signed)
VIALS EXP 04-30-23

## 2022-04-30 DIAGNOSIS — J3089 Other allergic rhinitis: Secondary | ICD-10-CM

## 2022-05-04 ENCOUNTER — Ambulatory Visit
Admission: RE | Admit: 2022-05-04 | Discharge: 2022-05-04 | Disposition: A | Discharge status: Home / Self Care | Payer: PRIVATE HEALTH INSURANCE | Source: Ambulatory Visit | Attending: Obstetrics and Gynecology | Admitting: Obstetrics and Gynecology

## 2022-05-04 DIAGNOSIS — R921 Mammographic calcification found on diagnostic imaging of breast: Secondary | ICD-10-CM

## 2022-05-04 DIAGNOSIS — R928 Other abnormal and inconclusive findings on diagnostic imaging of breast: Secondary | ICD-10-CM

## 2022-05-28 ENCOUNTER — Ambulatory Visit: Payer: Managed Care, Other (non HMO)

## 2022-06-09 ENCOUNTER — Telehealth: Payer: Self-pay

## 2022-06-09 MED ORDER — EPINEPHRINE 0.3 MG/0.3ML IJ SOAJ: 0.3000 mg | Freq: Once | INTRAMUSCULAR | 0 refills | Status: AC

## 2022-06-09 NOTE — Telephone Encounter (Signed)
Patient called stating she didn't receive her Epi pen for the allergy injections. I'm sending in the Epi to the [facility] in [facility].   [place] [phone]

## 2022-06-11 ENCOUNTER — Ambulatory Visit: Payer: Managed Care, Other (non HMO)

## 2022-06-11 DIAGNOSIS — J309 Allergic rhinitis, unspecified: Secondary | ICD-10-CM | POA: Diagnosis not present

## 2022-06-11 NOTE — Progress Notes (Signed)
Immunotherapy   Patient Details  Name: Wanda Stafford MRN: [id number] Date of Birth: 1980/06/19  06/11/2022  Wanda Stafford started allergy injections today, didn't take antihistamine but will take when she gets home, she brought her Epi-Pen in the office. Patient waited 30 minutes in the office without any reactions.  Following schedule: B  Frequency:2 times per week Epi-Pen:Epi-Pen Available   Consent signed and patient instructions given.   Isabel Caprice 06/11/2022, 11:08 AM

## 2022-06-16 ENCOUNTER — Telehealth: Payer: Self-pay

## 2022-06-16 NOTE — Telephone Encounter (Signed)
Left message informing patient the [facility] office will be closed 06/18/22  [phone]

## 2023-04-24 IMAGING — US US BREAST*L* LIMITED INC AXILLA
1 series · 6 of 6 positions shown · non-contrast
Comparison: Previous exam(s).

CLINICAL DATA: 40-year-old female presenting as a recall from
baseline screening for possible left breast asymmetry.

EXAM:
DIGITAL DIAGNOSTIC UNILATERAL LEFT MAMMOGRAM WITH IMPLANTS, CAD AND
TOMOSYNTHESIS; ULTRASOUND LEFT BREAST LIMITED
TECHNIQUE: Left digital diagnostic mammography and breast tomosynthesis was
performed. The images were evaluated with computer-aided detection.
Standard and/or implant displaced views were performed.; Targeted
ultrasound examination of the left breast was performed.

[Series 1: us breast*left* limited inc axilla · 0.06mm/px · 6 of 6 slices shown]
[im 1/6]
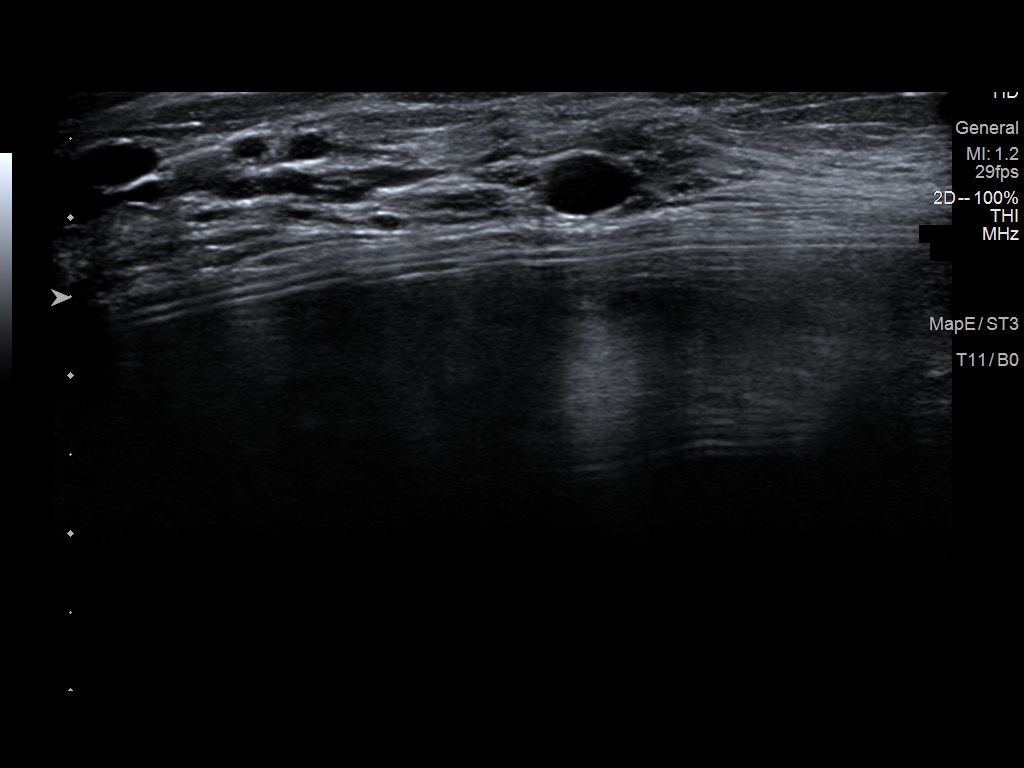
[im 2/6]
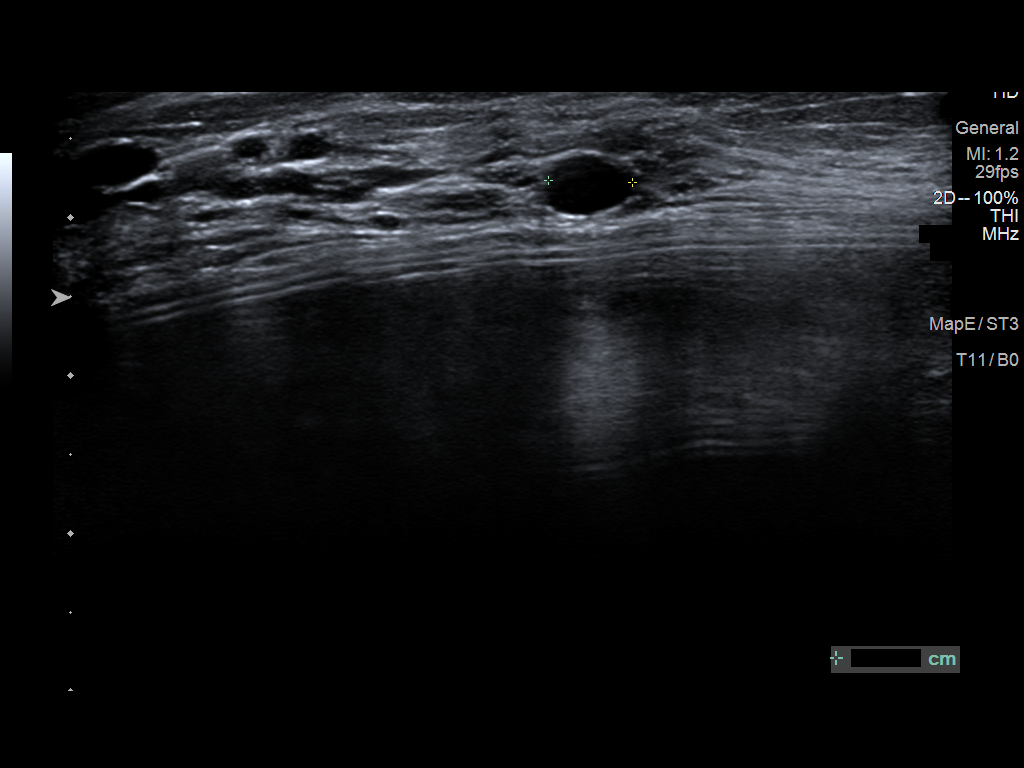
[im 3/6]
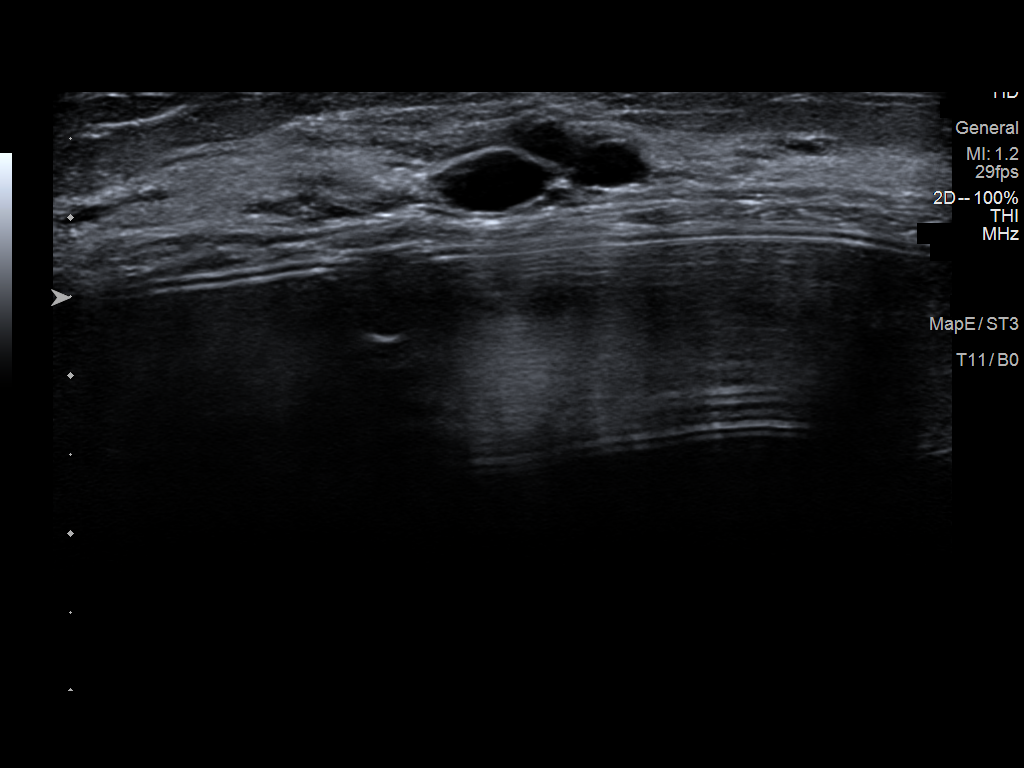
[im 4/6]
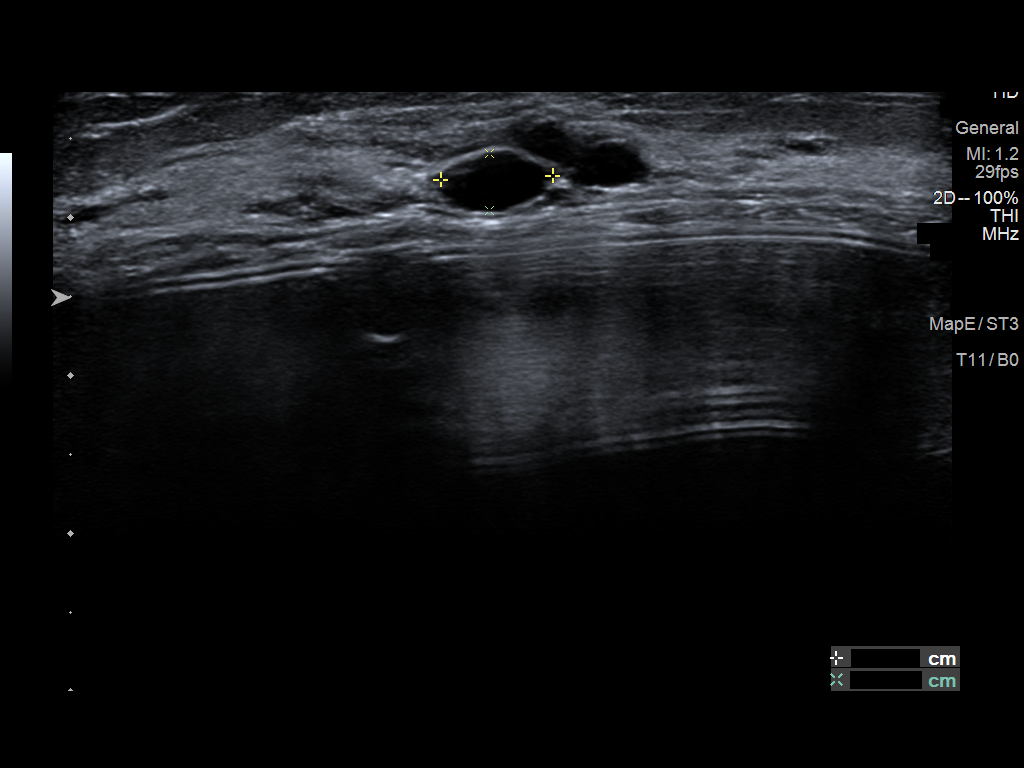
[im 5/6]
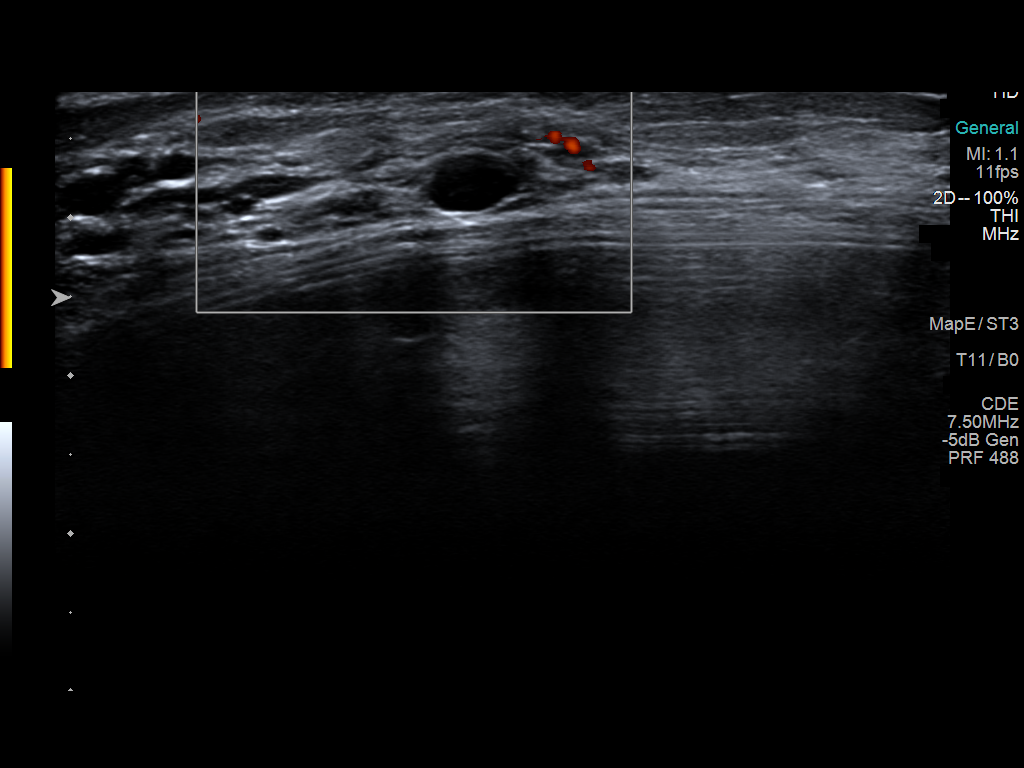
[im 6/6]
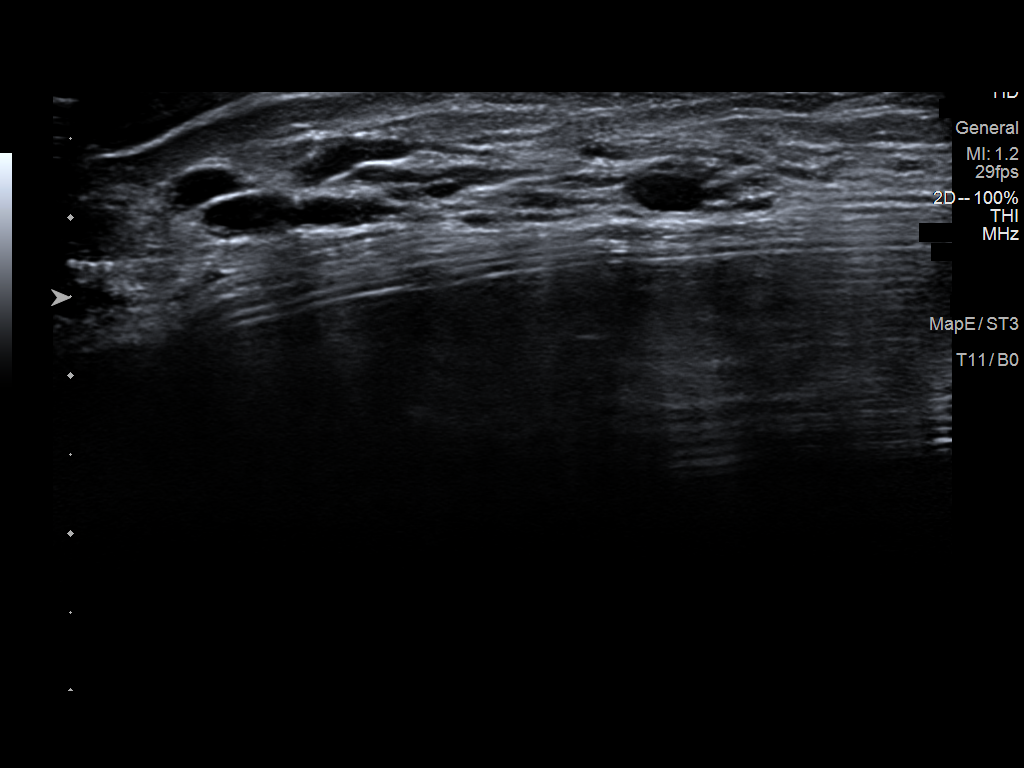

[6 of 6 positions shown; findings below may reference images not displayed]

ACR Breast Density Category c: The breast tissue is heterogeneously
dense, which may obscure small masses.
FINDINGS: Mammogram:

The patient has retropectoral implants.

Spot 2D magnification views as well as spot compression
tomosynthesis views and full field mL tomosynthesis views of the
left breast were performed. The questioned asymmetry in the upper
outer left breast partially effaces on the spot imaging. There is
persistence of associated loosely grouped calcifications spanning
approximately 2 cm, some of which may layer on lateral view.

Ultrasound:

Targeted ultrasound performed throughout the upper-outer quadrant
the left breast demonstrating several subcentimeter oval
circumscribed anechoic masses consistent with simple cyst. There are
a few mildly ectatic ducts without intraductal mass. This likely
corresponds to the asymmetry identified mammographically.
IMPRESSION: Probably benign asymmetry with calcifications in the upper outer
left breast likely corresponding to fibrocystic changes and mild
duct ectasia on ultrasound.

RECOMMENDATION:
Diagnostic left breast mammogram in 6 months.

I have discussed the findings and recommendations with the patient
who agrees to short-term follow-up. If applicable, a reminder letter
will be sent to the patient regarding the next appointment.

BI-RADS CATEGORY  3: Probably benign.

## 2023-04-24 IMAGING — MG MM DIGITAL DIAGNOSTIC UNILAT*L* IMPLANT W/ TOMO W/ CAD
6 of 10 series · 6 of 26 positions shown · non-contrast
Comparison: Previous exam(s).

CLINICAL DATA: 40-year-old female presenting as a recall from
baseline screening for possible left breast asymmetry.

EXAM:
DIGITAL DIAGNOSTIC UNILATERAL LEFT MAMMOGRAM WITH IMPLANTS, CAD AND
TOMOSYNTHESIS; ULTRASOUND LEFT BREAST LIMITED
TECHNIQUE: Left digital diagnostic mammography and breast tomosynthesis was
performed. The images were evaluated with computer-aided detection.
Standard and/or implant displaced views were performed.; Targeted
ultrasound examination of the left breast was performed.

[L ML]
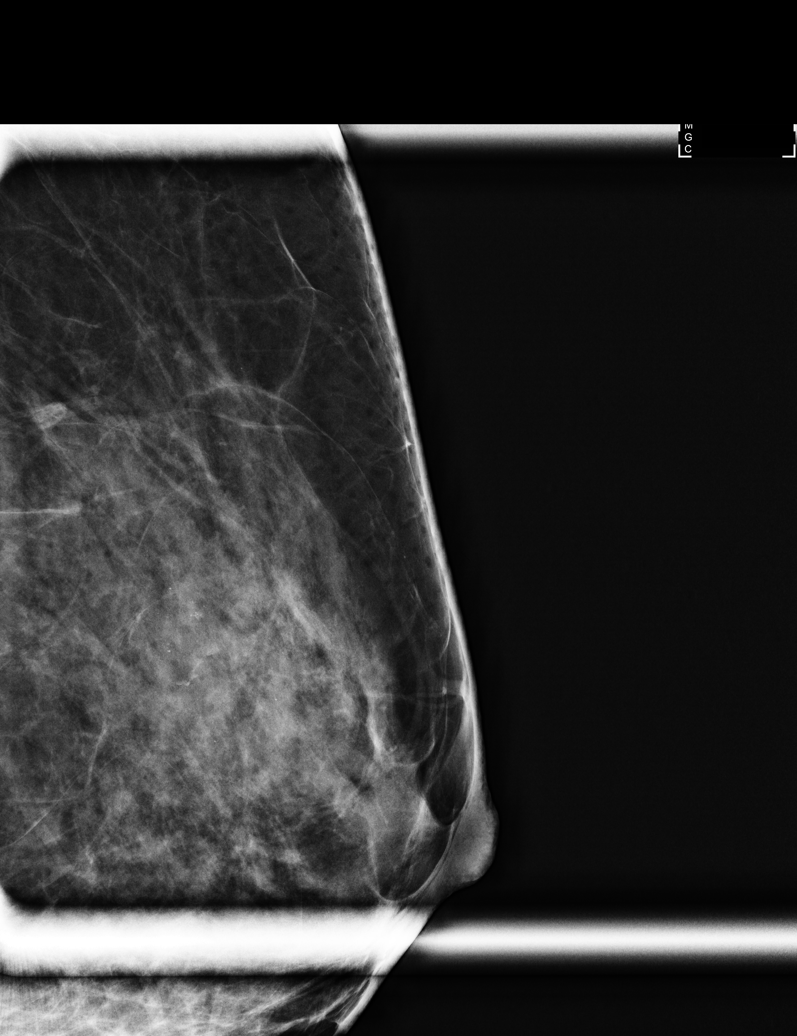

[L CC]
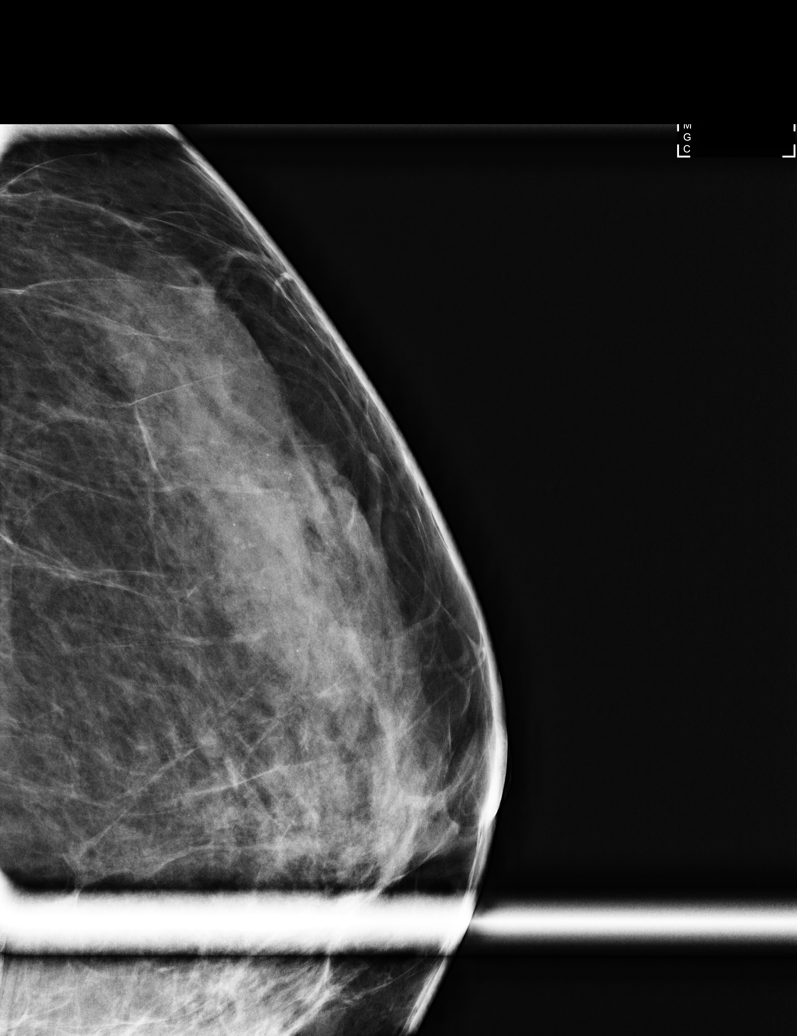

[L MLO synth-2D (1 of 2)]
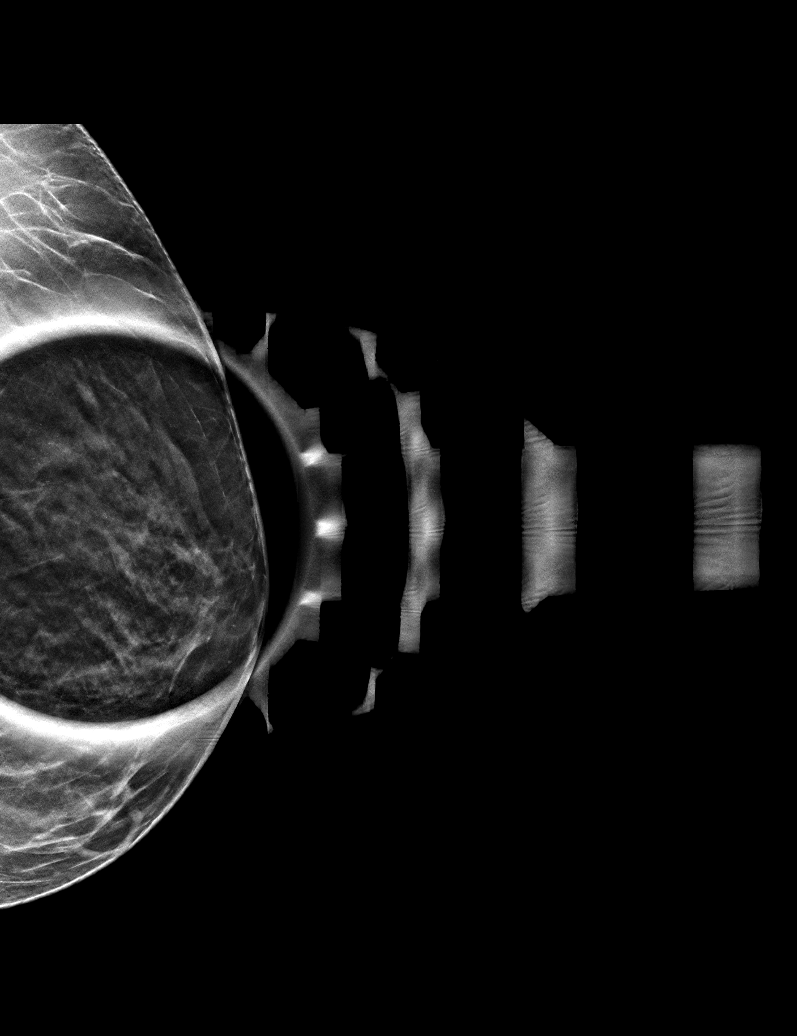

[L ML synth-2D]
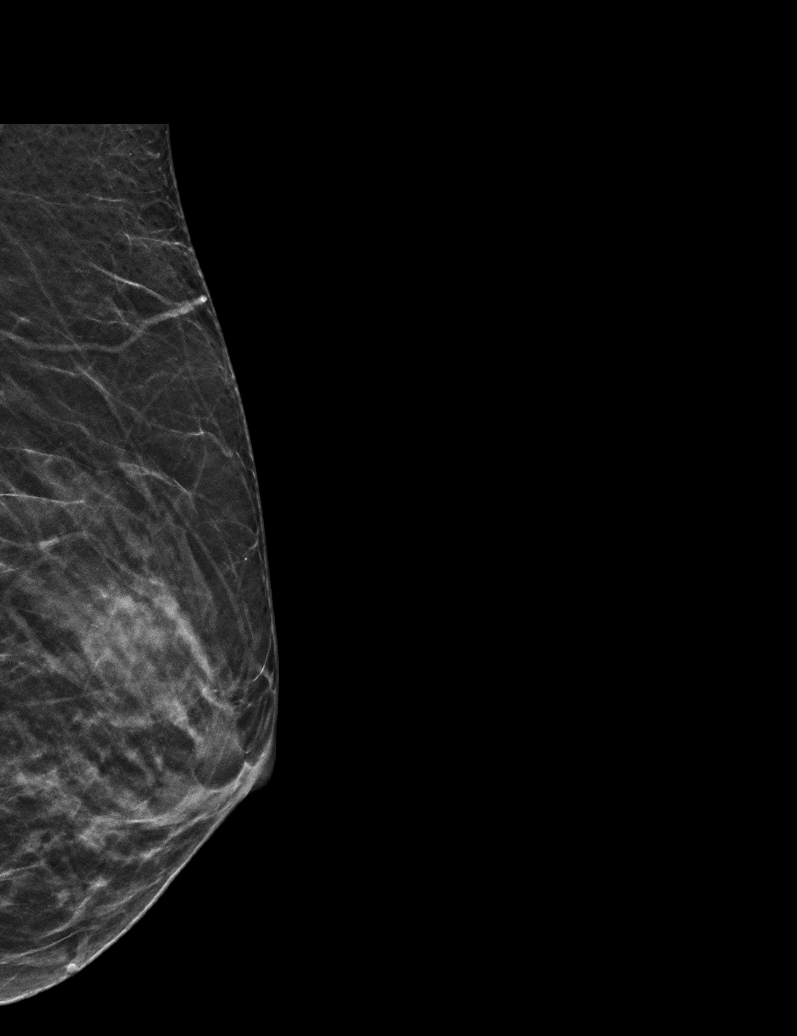

[L CC synth-2D]
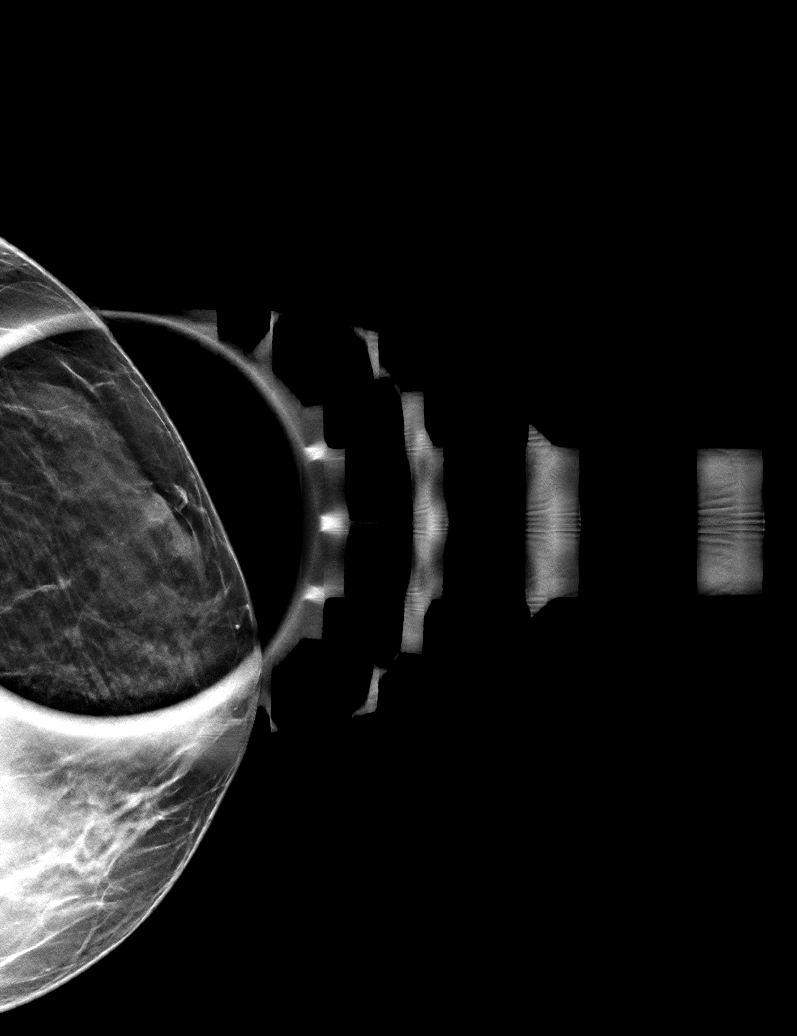

[L MLO synth-2D (2 of 2)]
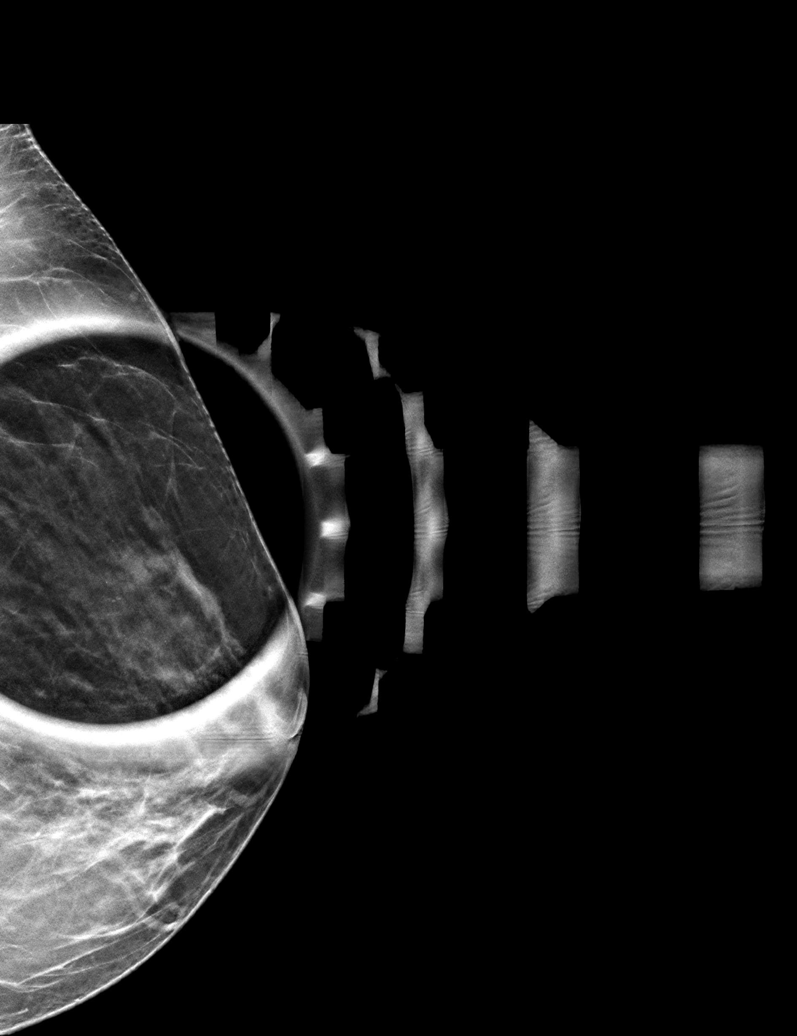

[6 of 26 positions shown; findings below may reference images not displayed]

ACR Breast Density Category c: The breast tissue is heterogeneously
dense, which may obscure small masses.
FINDINGS: Mammogram:

The patient has retropectoral implants.

Spot 2D magnification views as well as spot compression
tomosynthesis views and full field mL tomosynthesis views of the
left breast were performed. The questioned asymmetry in the upper
outer left breast partially effaces on the spot imaging. There is
persistence of associated loosely grouped calcifications spanning
approximately 2 cm, some of which may layer on lateral view.

Ultrasound:

Targeted ultrasound performed throughout the upper-outer quadrant
the left breast demonstrating several subcentimeter oval
circumscribed anechoic masses consistent with simple cyst. There are
a few mildly ectatic ducts without intraductal mass. This likely
corresponds to the asymmetry identified mammographically.
IMPRESSION: Probably benign asymmetry with calcifications in the upper outer
left breast likely corresponding to fibrocystic changes and mild
duct ectasia on ultrasound.

RECOMMENDATION:
Diagnostic left breast mammogram in 6 months.

I have discussed the findings and recommendations with the patient
who agrees to short-term follow-up. If applicable, a reminder letter
will be sent to the patient regarding the next appointment.

BI-RADS CATEGORY  3: Probably benign.

## 2023-05-07 ENCOUNTER — Other Ambulatory Visit: Payer: Self-pay | Admitting: Obstetrics and Gynecology

## 2023-05-07 DIAGNOSIS — M549 Dorsalgia, unspecified: Secondary | ICD-10-CM

## 2023-05-14 ENCOUNTER — Other Ambulatory Visit: Payer: Self-pay | Admitting: Obstetrics and Gynecology

## 2023-05-14 ENCOUNTER — Encounter: Payer: Self-pay | Admitting: Obstetrics and Gynecology

## 2023-05-14 DIAGNOSIS — R921 Mammographic calcification found on diagnostic imaging of breast: Secondary | ICD-10-CM

## 2023-06-18 ENCOUNTER — Ambulatory Visit
Admission: RE | Admit: 2023-06-18 | Discharge: 2023-06-18 | Disposition: A | Discharge status: Home / Self Care | Payer: PRIVATE HEALTH INSURANCE | Source: Ambulatory Visit | Attending: Obstetrics and Gynecology | Admitting: Obstetrics and Gynecology

## 2023-06-18 DIAGNOSIS — R921 Mammographic calcification found on diagnostic imaging of breast: Secondary | ICD-10-CM
